# Patient Record
Sex: Male | Born: 1955
Health system: Southern US, Community
[De-identification: ages and names within clinical notes are randomized; demographics above are authoritative.]

## PROBLEM LIST (undated history)

## (undated) DIAGNOSIS — F329 Major depressive disorder, single episode, unspecified: Secondary | ICD-10-CM

## (undated) DIAGNOSIS — F32A Depression, unspecified: Secondary | ICD-10-CM

## (undated) DIAGNOSIS — D689 Coagulation defect, unspecified: Secondary | ICD-10-CM

## (undated) DIAGNOSIS — N189 Chronic kidney disease, unspecified: Secondary | ICD-10-CM

## (undated) DIAGNOSIS — B1920 Unspecified viral hepatitis C without hepatic coma: Secondary | ICD-10-CM

## (undated) DIAGNOSIS — D66 Hereditary factor VIII deficiency: Secondary | ICD-10-CM

## (undated) HISTORY — PX: JOINT REPLACEMENT: SHX530

## (undated) HISTORY — DX: Coagulation defect, unspecified: D68.9

## (undated) HISTORY — DX: Chronic kidney disease, unspecified: N18.9

## (undated) HISTORY — DX: Depression, unspecified: F32.A

## (undated) HISTORY — DX: Hereditary factor VIII deficiency: D66

## (undated) HISTORY — DX: Unspecified viral hepatitis C without hepatic coma: B19.20

## (undated) HISTORY — DX: Major depressive disorder, single episode, unspecified: F32.9

---

## 2011-03-08 ENCOUNTER — Ambulatory Visit (INDEPENDENT_AMBULATORY_CARE_PROVIDER_SITE_OTHER): Payer: Managed Care, Other (non HMO) | Admitting: Family Medicine

## 2011-03-08 DIAGNOSIS — F4321 Adjustment disorder with depressed mood: Secondary | ICD-10-CM

## 2011-03-08 DIAGNOSIS — F17200 Nicotine dependence, unspecified, uncomplicated: Secondary | ICD-10-CM

## 2011-03-08 DIAGNOSIS — M2550 Pain in unspecified joint: Secondary | ICD-10-CM

## 2011-05-28 ENCOUNTER — Encounter: Payer: Self-pay | Admitting: Internal Medicine

## 2011-05-28 ENCOUNTER — Ambulatory Visit (INDEPENDENT_AMBULATORY_CARE_PROVIDER_SITE_OTHER): Payer: Managed Care, Other (non HMO) | Admitting: Internal Medicine

## 2011-05-28 DIAGNOSIS — F32A Depression, unspecified: Secondary | ICD-10-CM | POA: Insufficient documentation

## 2011-05-28 DIAGNOSIS — Z7189 Other specified counseling: Secondary | ICD-10-CM

## 2011-05-28 DIAGNOSIS — F339 Major depressive disorder, recurrent, unspecified: Secondary | ICD-10-CM

## 2011-05-28 DIAGNOSIS — R5383 Other fatigue: Secondary | ICD-10-CM

## 2011-05-28 DIAGNOSIS — G8929 Other chronic pain: Secondary | ICD-10-CM | POA: Insufficient documentation

## 2011-05-28 DIAGNOSIS — M2500 Hemarthrosis, unspecified joint: Secondary | ICD-10-CM

## 2011-05-28 DIAGNOSIS — B1920 Unspecified viral hepatitis C without hepatic coma: Secondary | ICD-10-CM | POA: Insufficient documentation

## 2011-05-28 DIAGNOSIS — D66 Hereditary factor VIII deficiency: Secondary | ICD-10-CM | POA: Insufficient documentation

## 2011-05-28 DIAGNOSIS — F329 Major depressive disorder, single episode, unspecified: Secondary | ICD-10-CM

## 2011-05-28 LAB — POCT URINALYSIS DIPSTICK
Glucose, UA: NEGATIVE
Ketones, UA: NEGATIVE
Spec Grav, UA: >=1.03

## 2011-05-28 LAB — POCT UA - MICROSCOPIC ONLY
Mucus, UA: NEGATIVE
WBC, Ur, HPF, POC: NEGATIVE

## 2011-05-28 NOTE — Progress Notes (Signed)
  Subjective:    Patient ID: Darren Torres, male    DOB: 06/26/1955, 56 y.o.   MRN: [id number]  HPI No problems at this time. See problem list.   Review of Systems Cpe scheduled soon    Objective:   Physical Exam  Normal for him today      Assessment & Plan:   Full CPE soon Will refill meds as needed  Hemtologist [facility] Hepatologist [phone]

## 2011-05-29 LAB — CBC WITH DIFFERENTIAL/PLATELET
Basophils Absolute: 0.1 10*3/uL (ref 0.0–0.1)
Eosinophils Relative: 4 % (ref 0–5)
HCT: 45.3 % (ref 39.0–52.0)
Lymphocytes Relative: 44 % (ref 12–46)
MCHC: 34.7 g/dL (ref 30.0–36.0)
MCV: 93.8 fL (ref 78.0–100.0)
Monocytes Absolute: 0.6 10*3/uL (ref 0.1–1.0)
Monocytes Relative: 9 % (ref 3–12)
RDW: 12.6 % (ref 11.5–15.5)
WBC: 5.9 10*3/uL (ref 4.0–10.5)

## 2011-05-29 LAB — COMPREHENSIVE METABOLIC PANEL
AST: 36 U/L (ref 0–37)
BUN: 12 mg/dL (ref 6–23)
Calcium: 9.7 mg/dL (ref 8.4–10.5)
Chloride: 105 mEq/L (ref 96–112)
Creat: 0.63 mg/dL (ref 0.50–1.35)
Glucose, Bld: 98 mg/dL (ref 70–99)

## 2011-05-29 LAB — LIPID PANEL
Cholesterol: 155 mg/dL (ref 0–200)
HDL: 45 mg/dL (ref >39)
Total CHOL/HDL Ratio: 3.4 Ratio
Triglycerides: 80 mg/dL (ref <150)

## 2011-05-29 LAB — TSH: TSH: 1.437 u[IU]/mL (ref 0.350–4.500)

## 2011-05-29 LAB — PSA: PSA: 0.81 ng/mL (ref <=4.00)

## 2011-06-26 ENCOUNTER — Encounter: Payer: Self-pay | Admitting: Internal Medicine

## 2011-08-20 ENCOUNTER — Ambulatory Visit (INDEPENDENT_AMBULATORY_CARE_PROVIDER_SITE_OTHER): Payer: Medicare Other | Admitting: Internal Medicine

## 2011-08-20 ENCOUNTER — Encounter: Payer: Self-pay | Admitting: Internal Medicine

## 2011-08-20 VITALS — BP 130/77 | HR 64 | Temp 97.2°F | Resp 16 | Ht 70.5 in | Wt 145.5 lb

## 2011-08-20 DIAGNOSIS — N200 Calculus of kidney: Secondary | ICD-10-CM

## 2011-08-20 DIAGNOSIS — D66 Hereditary factor VIII deficiency: Secondary | ICD-10-CM

## 2011-08-20 DIAGNOSIS — R319 Hematuria, unspecified: Secondary | ICD-10-CM

## 2011-08-20 LAB — POCT URINALYSIS DIPSTICK
Leukocytes, UA: NEGATIVE
Nitrite, UA: NEGATIVE
Protein, UA: NEGATIVE
Urobilinogen, UA: 0.2
pH, UA: 6

## 2011-08-20 LAB — POCT UA - MICROSCOPIC ONLY
Casts, Ur, LPF, POC: NEGATIVE
Yeast, UA: NEGATIVE

## 2011-08-20 NOTE — Progress Notes (Signed)
  Subjective:    Patient ID: Quentyn Kolbeck, male    DOB: Oct 20, 1955, 56 y.o.   MRN: [id number]  HPI Kidney stone in er,has past it. Has hemophilia, hep c . Used factor 8 for brisk hematuria. Still smoking   Review of Systems See list    Objective:   Physical Exam  Normal  ccua    Results for orders placed in visit on 08/20/11  POCT URINALYSIS DIPSTICK      Component Value Range   Color, UA yellow     Clarity, UA clear     Glucose, UA neg     Bilirubin, UA neg     Ketones, UA neg     Spec Grav, UA <=1.005     Blood, UA neg     pH, UA 6.0     Protein, UA neg     Urobilinogen, UA 0.2     Nitrite, UA neg     Leukocytes, UA Negative    POCT UA - MICROSCOPIC ONLY      Component Value Range   WBC, Ur, HPF, POC neg     RBC, urine, microscopic 0-1     Bacteria, U Microscopic neg     Mucus, UA neg     Epithelial cells, urine per micros o-1     Crystals, Ur, HPF, POC neg     Casts, Ur, LPF, POC neg     Yeast, UA neg      Assessment & Plan:  Hematuria resolved

## 2011-09-03 ENCOUNTER — Encounter: Payer: Medicare Other | Admitting: Internal Medicine

## 2011-09-10 ENCOUNTER — Ambulatory Visit: Payer: Medicare Other | Admitting: Internal Medicine

## 2011-11-23 ENCOUNTER — Other Ambulatory Visit: Payer: Self-pay | Admitting: Internal Medicine

## 2011-11-23 NOTE — Telephone Encounter (Signed)
Needs office visit.

## 2011-12-17 ENCOUNTER — Encounter: Payer: Medicare Other | Admitting: Internal Medicine

## 2012-01-21 ENCOUNTER — Other Ambulatory Visit: Payer: Self-pay | Admitting: *Deleted

## 2012-01-21 ENCOUNTER — Other Ambulatory Visit: Payer: Self-pay | Admitting: Physician Assistant

## 2012-01-21 NOTE — Telephone Encounter (Signed)
PATTY STATES HER HUSBAND'S MEDS NEED TO BE CALLED IN FOR A 90 DAY SUPPLY WITH REFILLS TO LAST A YEAR HE IS ON A LOT OF MEDS AND THEY ALL NEED REFILLING THE SAME WAY PLEASE CALL [phone]   [facility] ON [address]

## 2012-04-28 ENCOUNTER — Ambulatory Visit (INDEPENDENT_AMBULATORY_CARE_PROVIDER_SITE_OTHER): Payer: Medicare PPO | Admitting: Internal Medicine

## 2012-04-28 ENCOUNTER — Encounter: Payer: Self-pay | Admitting: Internal Medicine

## 2012-04-28 ENCOUNTER — Other Ambulatory Visit: Payer: Self-pay | Admitting: *Deleted

## 2012-04-28 ENCOUNTER — Ambulatory Visit: Payer: Medicare PPO

## 2012-04-28 ENCOUNTER — Telehealth: Payer: Self-pay | Admitting: *Deleted

## 2012-04-28 VITALS — BP 119/71 | HR 66 | Temp 98.2°F | Resp 18 | Ht 69.0 in | Wt 147.0 lb

## 2012-04-28 DIAGNOSIS — Z0000 Encounter for general adult medical examination without abnormal findings: Secondary | ICD-10-CM

## 2012-04-28 DIAGNOSIS — D66 Hereditary factor VIII deficiency: Secondary | ICD-10-CM

## 2012-04-28 DIAGNOSIS — H269 Unspecified cataract: Secondary | ICD-10-CM

## 2012-04-28 DIAGNOSIS — Z13220 Encounter for screening for lipoid disorders: Secondary | ICD-10-CM

## 2012-04-28 DIAGNOSIS — M1990 Unspecified osteoarthritis, unspecified site: Secondary | ICD-10-CM

## 2012-04-28 DIAGNOSIS — F17200 Nicotine dependence, unspecified, uncomplicated: Secondary | ICD-10-CM

## 2012-04-28 DIAGNOSIS — Z125 Encounter for screening for malignant neoplasm of prostate: Secondary | ICD-10-CM

## 2012-04-28 DIAGNOSIS — R269 Unspecified abnormalities of gait and mobility: Secondary | ICD-10-CM

## 2012-04-28 DIAGNOSIS — K219 Gastro-esophageal reflux disease without esophagitis: Secondary | ICD-10-CM

## 2012-04-28 LAB — COMPREHENSIVE METABOLIC PANEL
Albumin: 4.3 g/dL (ref 3.5–5.2)
Alkaline Phosphatase: 122 U/L — ABNORMAL HIGH (ref 39–117)
BUN: 13 mg/dL (ref 6–23)
CO2: 27 mEq/L (ref 19–32)
Calcium: 9.3 mg/dL (ref 8.4–10.5)
Chloride: 107 mEq/L (ref 96–112)
Glucose, Bld: 98 mg/dL (ref 70–99)
Potassium: 3.7 mEq/L (ref 3.5–5.3)
Total Protein: 6.9 g/dL (ref 6.0–8.3)

## 2012-04-28 LAB — POCT URINALYSIS DIPSTICK
Blood, UA: NEGATIVE
Ketones, UA: NEGATIVE
Protein, UA: NEGATIVE
Spec Grav, UA: 1.015
Urobilinogen, UA: 0.2

## 2012-04-28 LAB — TSH: TSH: 2.855 u[IU]/mL (ref 0.350–4.500)

## 2012-04-28 LAB — IFOBT (OCCULT BLOOD): IFOBT: POSITIVE

## 2012-04-28 LAB — CBC WITH DIFFERENTIAL/PLATELET
Basophils Relative: 1 % (ref 0–1)
HCT: 43.4 % (ref 39.0–52.0)
Hemoglobin: 15 g/dL (ref 13.0–17.0)
Lymphs Abs: 2.5 10*3/uL (ref 0.7–4.0)
MCHC: 34.6 g/dL (ref 30.0–36.0)
Monocytes Absolute: 0.5 10*3/uL (ref 0.1–1.0)
Monocytes Relative: 9 % (ref 3–12)
Neutro Abs: 2.6 10*3/uL (ref 1.7–7.7)
RBC: 4.99 MIL/uL (ref 4.22–5.81)

## 2012-04-28 LAB — POCT UA - MICROSCOPIC ONLY
Bacteria, U Microscopic: NEGATIVE
Casts, Ur, LPF, POC: NEGATIVE
Crystals, Ur, HPF, POC: NEGATIVE

## 2012-04-28 LAB — LIPID PANEL
Cholesterol: 162 mg/dL (ref 0–200)
Triglycerides: 80 mg/dL (ref <150)

## 2012-04-28 LAB — PULMONARY FUNCTION TEST

## 2012-04-28 LAB — PSA: PSA: 0.77 ng/mL (ref <=4.00)

## 2012-04-28 MED ORDER — CELECOXIB 100 MG PO CAPS: 200.0000 mg | ORAL_CAPSULE | Freq: Two times a day (BID) | ORAL | Status: DC

## 2012-04-28 MED ORDER — CELECOXIB 200 MG PO CAPS: 200.0000 mg | ORAL_CAPSULE | Freq: Two times a day (BID) | ORAL | Status: DC

## 2012-04-28 MED ORDER — CITALOPRAM HYDROBROMIDE 40 MG PO TABS: 40.0000 mg | ORAL_TABLET | Freq: Every day | ORAL | Status: DC

## 2012-04-28 MED ORDER — RANITIDINE HCL 150 MG PO TABS: 150.0000 mg | ORAL_TABLET | Freq: Two times a day (BID) | ORAL | Status: DC

## 2012-04-28 NOTE — Telephone Encounter (Signed)
Error

## 2012-04-28 NOTE — Progress Notes (Signed)
  Subjective:    Patient ID: Darren Torres, male    DOB: Sep 22, 1955, 57 y.o.   MRN: [id number]  HPI    Review of Systems  Constitutional: Positive for diaphoresis and fatigue.  HENT: Positive for hearing loss and tinnitus.   Eyes: Positive for discharge.  Respiratory: Positive for apnea.   Gastrointestinal: Positive for constipation.  Musculoskeletal: Positive for arthralgias and gait problem.  Hematological: Bruises/bleeds easily.       Objective:   Physical Exam        Assessment & Plan:

## 2012-04-28 NOTE — Patient Instructions (Signed)
Immunization Schedule, Adult  Influenza vaccine.  Adults should be given 1 dose every year.  Tetanus, diphtheria, and pertussis (Td, Tdap) vaccine.  Adults who have not previously been given Tdap or who do not know their vaccine status should be given 1 dose of Tdap.  Adults should have a Td booster every 10 years.  Doses should be given if needed to catch up on missed doses in the past.  Pregnant women should be given 1 dose of Tdap vaccine during each pregnancy.  Varicella vaccine.  All adults without evidence of immunity to varicella should receive 2 doses or a second dose if they have received only 1 dose.  Pregnant women who do not have evidence of immunity should be given the first dose after their pregnancy.  Human papillomavirus (HPV) vaccine.  Women aged 13 through 26 years who have not been given the vaccine previously should be given the 3 dose series. The second dose should be given 1 to 2 months after the first dose. The third dose should be given at least 24 weeks after the first dose.  The vaccine is not recommended for use in pregnant women. However, pregnancy testing is not needed before being given a dose. If a woman is found to be pregnant after being given a dose, no treatment is needed. In that case, the remaining doses should be delayed until after the pregnancy.  Men aged 13 through 21 years who have not been given the vaccine previously should be given the 3 dose series. Men aged 22 through 26 years may be given the 3 dose series. The second dose should be given 1 to 2 months after the first dose. The third dose should be given at least 24 weeks after the first dose.  Zoster vaccine.  One dose is recommended for adults aged 60 years and older unless certain conditions are present.  Measles, mumps, and rubella (MMR) vaccine.  Adults born before 1957 generally are considered immune to measles and mumps. Healthcare workers born before 1957 who do not have  evidence of immunity should consider vaccination.  Adults born in 1957 or later should have 1 or more doses of MMR vaccine unless there is a contraindication for the vaccine or they have evidence of immunity to the diseases. A second dose should be given at least 28 days after the first dose. Adults receiving certain types of previous vaccines should consider or be given vaccine doses.  For women of childbearing age, rubella immunity should be determined. If there is no evidence of immunity, women who are not pregnant should be vaccinated. If there is no evidence of immunity, women who are pregnant should delay vaccination until after their pregnancy.  Pneumococcal polysaccharide (PPSV23) vaccine.  All adults aged 65 years and older should be given 1 dose.  Adults younger than age 65 years who have certain medical conditions, who smoke cigarettes, who reside in nursing homes or long-term care facilities, or who have an unknown vaccination history should usually be given 1 or 2 doses of the vaccine.  Pneumococcal 13-valent conjugate (PVC13) vaccine.  Adults aged 19 years or older with certain medical conditions and an unknown or incomplete pneumococcal vaccination history should usually be given 1 dose of the vaccine. This dose may be in addition to a PPSV23 vaccine dose.  Meningococcal vaccine.  First-year college students up to age 21 years who are living in residence halls should be given a dose if they did not receive a dose on   or after their 16th birthday.  A dose should be given to microbiologists working with certain meningitis bacteria, military recruits, and people who travel to or live in countries with a high rate of meningitis.  One or 2 doses should be given to adults who have certain high-risk conditions.  Hepatitis A vaccine.  Adults who wish to be protected from this disease, who have certain high-risk conditions, who work with hepatitis A-infected animals, who work in  hepatitis A research labs, or who travel to or work in countries with a high rate of hepatitis A should be given the 2 dose series of the vaccine.  Adults who were previously unvaccinated and who anticipate close contact with an international adoptee during the first 60 days after arrival in the [place] from a country with a high rate of hepatitis A should be given the vaccine. The first dose of the 2 dose series should be given 2 or more weeks before the arrival of the adoptee.  Hepatitis B vaccine.  Adults who wish to be protected from this disease, who have certain high-risk conditions, who may be exposed to blood or other infectious body fluids, who are household contacts or sex partners of hepatitis B positive people, who are clients or workers in certain care facilities, or who travel to or work in countries with a high rate of hepatitis B should be given the 3 dose series of the vaccine. If you travel outside the [place], additional vaccines may be needed. The Centers for Disease Control and Prevention (CDC) provides information about the vaccines, medicines, and other measures necessary to prevent illness and injury during international travel. Visit the CDC website at www.cdc.gov/travel or call (800) CDC-INFO [800-232-4636]. You may also consult a travel clinic or your caregiver. Document Released: 05/26/2003 Document Revised: 05/28/2011 Document Reviewed: 04/20/2011 [employer] Patient Information 2013 [employer].  

## 2012-04-28 NOTE — Telephone Encounter (Signed)
[facility] would like for you to clarify directions for celebrex

## 2012-04-28 NOTE — Progress Notes (Signed)
  Subjective:    Patient ID: Darren Torres, male    DOB: [birth year], 57 y.o.   MRN: [id number]  HPI Active hepatitis C txed twice at [facility]. Severe hemophilia with associated severe arthropathy with many joint surgeries and total joints. Depression and chronic pain. GERD Chronic pain on daily morphine etc.  Review of Systems  Constitutional: Positive for diaphoresis and fatigue.  HENT: Positive for tinnitus.   Eyes: Positive for discharge.  Respiratory: Positive for apnea and cough.   Cardiovascular: Positive for palpitations.  Gastrointestinal: Positive for constipation.  Musculoskeletal: Positive for joint swelling, arthralgias and gait problem.  Allergic/Immunologic: Negative.   Neurological: Negative.   Hematological: Bruises/bleeds easily.  Psychiatric/Behavioral: Positive for dysphoric mood.       Objective:   Physical Exam  Constitutional: He is oriented to person, place, and time. Vital signs are normal. He appears cachectic. He is cooperative. He has a sickly appearance. No distress.  HENT:  Right Ear: External ear normal.  Left Ear: External ear normal.  Nose: Nose normal.  Mouth/Throat: Oropharynx is clear and moist.  Eyes: EOM are normal. Pupils are equal, round, and reactive to light. No scleral icterus.  Neck: Normal range of motion. Neck supple. No tracheal deviation present. No thyromegaly present.  Cardiovascular: Normal rate, regular rhythm and normal heart sounds.   Pulmonary/Chest: Effort normal and breath sounds normal. He has no wheezes. He exhibits no tenderness.  Abdominal: Soft. Bowel sounds are normal. He exhibits no mass. There is tenderness.  Genitourinary: Rectum normal, prostate normal and penis normal.  Musculoskeletal: He exhibits edema and tenderness.  Has new right shoulder total joint Has right and left total knees Has fused left ankle and wants right fused Needs left shoulder  Lymphadenopathy:    He has no cervical adenopathy.   Neurological: He is alert and oriented to person, place, and time. He displays no atrophy. No cranial nerve deficit or sensory deficit. Coordination and gait abnormal.  Skin: No rash noted.  Psychiatric: He has a normal mood and affect. His behavior is normal. Judgment and thought content normal.   Results for orders placed in visit on 08/20/11  POCT URINALYSIS DIPSTICK      Result Value Range   Color, UA yellow     Clarity, UA clear     Glucose, UA neg     Bilirubin, UA neg     Ketones, UA neg     Spec Grav, UA <=1.005     Blood, UA neg     pH, UA 6.0     Protein, UA neg     Urobilinogen, UA 0.2     Nitrite, UA neg     Leukocytes, UA Negative    POCT UA - MICROSCOPIC ONLY      Result Value Range   WBC, Ur, HPF, POC neg     RBC, urine, microscopic 0-1     Bacteria, U Microscopic neg     Mucus, UA neg     Epithelial cells, urine per micros o-1     Crystals, Ur, HPF, POC neg     Casts, Ur, LPF, POC neg     Yeast, UA neg            Assessment & Plan:  RF all meds 1 year Discussed risks of celebrex GI, kidney, CV, liver and we agreed to ween of and dc celebrex

## 2012-09-19 ENCOUNTER — Other Ambulatory Visit: Payer: Self-pay | Admitting: Physician Assistant

## 2012-10-14 ENCOUNTER — Ambulatory Visit (INDEPENDENT_AMBULATORY_CARE_PROVIDER_SITE_OTHER): Payer: Medicare PPO | Admitting: Family Medicine

## 2012-10-14 VITALS — BP 120/68 | HR 60 | Temp 97.8°F | Resp 16 | Ht 68.5 in | Wt 136.0 lb

## 2012-10-14 DIAGNOSIS — D66 Hereditary factor VIII deficiency: Secondary | ICD-10-CM

## 2012-10-14 MED ORDER — ANTIHEM FACTOR RECOMB (RFVIII) 500 UNITS IV KIT
44.0000 [IU]/kg | PACK | Freq: Once | INTRAVENOUS | Status: AC
  Administered 2012-10-14: 2715 [IU] via INTRAVENOUS

## 2012-10-14 NOTE — Progress Notes (Signed)
Subjective:    Patient ID: Darren Torres, male    DOB: [birth year], 57 y.o.   MRN: [id number] Chief Complaint  Patient presents with  . Injections    needs clotting factor injected his wife was unable to get vein.    HPI  Has a h/o hemophilia treated with IV factor 8 when he has a brisk bleed. Followed by [facility] hematology. Wife always gives IV factor 8 at home when pt is bleeding but today has tried 4 times but keeps on blowing the vein so they came here for injection.  The medication has already been reconstituted and is in a syringe - has to be administed w/in 3 hrs and they are still well w/in that window. He is having bleeding in his left elbow - it is wrapped and in a sling, has pain with any movement. Is currently on chronic pain medication.  Past Medical History  Diagnosis Date  . Hemophilia   . Depression   . Hepatitis C   . Chronic kidney disease   . Clotting disorder    Current Outpatient Prescriptions on File Prior to Visit  Medication Sig Dispense Refill  . calcium-vitamin D (OSCAL WITH D) 250-125 MG-UNIT per tablet Take 1 tablet by mouth daily.      Jennette Banker Sodium 30-100 MG CAPS Take by mouth.      . morphine (MSIR) 15 MG tablet Take 15 mg by mouth every 4 (four) hours as needed.      . ranitidine (ZANTAC) 150 MG tablet Take 1 tablet (150 mg total) by mouth 2 (two) times daily.  180 tablet  3  . celecoxib (CELEBREX) 100 MG capsule Take 2 capsules (200 mg total) by mouth 2 (two) times daily. Take one 200mg  po bid, po pc and watch for dark stools  180 capsule  3  . celecoxib (CELEBREX) 200 MG capsule Take 1 capsule (200 mg total) by mouth 2 (two) times daily. As needed for pain  180 capsule  3  . citalopram (CELEXA) 40 MG tablet Take 1 tablet (40 mg total) by mouth daily.  90 tablet  2  . lactobacillus acidophilus (BACID) TABS Take 2 tablets by mouth daily.      . Multiple Vitamin (MULTIVITAMIN) tablet Take 1 tablet by mouth daily.      . NON FORMULARY  Complete thymic formula immune system complex various vits and herbs take cap qd       No current facility-administered medications on file prior to visit.   Allergies  Allergen Reactions  . Chantix (Varenicline Tartrate) Other (See Comments)    Changes mood  . Wellbutrin (Bupropion Hcl) Palpitations  . Asa Arthritis Strength-Antacid (Aspirin Buffered) Other (See Comments)    Due to blood thinner  . Other     cryopercipate     Review of Systems  Musculoskeletal: Positive for myalgias, joint swelling and arthralgias.  Skin: Positive for color change and wound.      BP 120/68  Pulse 60  Temp(Src) 97.8 F (36.6 C) (Oral)  Resp 16  Ht 5' 8.5" (1.74 m)  Wt 136 lb (61.689 kg)  BMI 20.38 kg/m2  SpO2 96% Objective:   Physical Exam  Constitutional: He is oriented to person, place, and time. He appears well-developed. No distress.  HENT:  Head: Normocephalic and atraumatic.  Pulmonary/Chest: Effort normal.  Neurological: He is alert and oriented to person, place, and time.  Skin: Skin is warm and dry. He is not diaphoretic.  Psychiatric: He has  a normal mood and affect. His behavior is normal.      Assessment & Plan:  Hemophilia

## 2012-10-27 ENCOUNTER — Ambulatory Visit: Payer: Medicare PPO | Admitting: Internal Medicine

## 2012-11-03 ENCOUNTER — Ambulatory Visit (INDEPENDENT_AMBULATORY_CARE_PROVIDER_SITE_OTHER): Payer: Medicare PPO | Admitting: Internal Medicine

## 2012-11-03 ENCOUNTER — Encounter: Payer: Self-pay | Admitting: Internal Medicine

## 2012-11-03 VITALS — BP 126/78 | HR 60 | Temp 97.9°F | Resp 16 | Ht 70.0 in | Wt 141.0 lb

## 2012-11-03 DIAGNOSIS — Z79899 Other long term (current) drug therapy: Secondary | ICD-10-CM

## 2012-11-03 DIAGNOSIS — G894 Chronic pain syndrome: Secondary | ICD-10-CM

## 2012-11-03 DIAGNOSIS — G8929 Other chronic pain: Secondary | ICD-10-CM

## 2012-11-03 DIAGNOSIS — M19012 Primary osteoarthritis, left shoulder: Secondary | ICD-10-CM

## 2012-11-03 DIAGNOSIS — M19019 Primary osteoarthritis, unspecified shoulder: Secondary | ICD-10-CM

## 2012-11-03 DIAGNOSIS — D66 Hereditary factor VIII deficiency: Secondary | ICD-10-CM

## 2012-11-03 LAB — CBC WITH DIFFERENTIAL/PLATELET
Basophils Absolute: 0.1 10*3/uL (ref 0.0–0.1)
Basophils Relative: 1 % (ref 0–1)
Eosinophils Relative: 3 % (ref 0–5)
HCT: 41 % (ref 39.0–52.0)
Lymphocytes Relative: 47 % — ABNORMAL HIGH (ref 12–46)
MCH: 30.5 pg (ref 26.0–34.0)
MCHC: 34.6 g/dL (ref 30.0–36.0)
MCV: 88 fL (ref 78.0–100.0)
Monocytes Absolute: 0.4 10*3/uL (ref 0.1–1.0)
RDW: 13.6 % (ref 11.5–15.5)

## 2012-11-03 LAB — COMPREHENSIVE METABOLIC PANEL
AST: 24 U/L (ref 0–37)
BUN: 13 mg/dL (ref 6–23)
Calcium: 9.3 mg/dL (ref 8.4–10.5)
Chloride: 104 mEq/L (ref 96–112)
Creat: 0.64 mg/dL (ref 0.50–1.35)

## 2012-11-03 NOTE — Progress Notes (Signed)
  Subjective:    Patient ID: Darren Torres, male    DOB: 11/03/1955, 57 y.o.   MRN: [id number]  HPI Hemophilia--had recent transfusion of cloters. Joints are stable, swell and bleed less. Still smoking   Review of Systems unchanged    Objective:   Physical Exam  Vitals reviewed. Constitutional: He is oriented to person, place, and time. He appears well-developed and well-nourished. No distress.  HENT:  Right Ear: External ear normal.  Left Ear: External ear normal.  Eyes: EOM are normal. No scleral icterus.  Neck: Neck supple.  Cardiovascular: Normal rate and normal heart sounds.   Pulmonary/Chest: Effort normal.  Musculoskeletal: He exhibits edema and tenderness.  Indurated, tender joints all over asymmetric caused by chronic bleeding int joints  Neurological: He is alert and oriented to person, place, and time. No cranial nerve deficit. He exhibits normal muscle tone. Gait abnormal. Coordination normal.  Skin: Rash noted.  Psychiatric: He has a normal mood and affect. His behavior is normal.    Cbc/cmet      Assessment & Plan:  DCed celebrex using morphine only Hemophilia stable/controlled Smoker/Must quit

## 2012-11-03 NOTE — Patient Instructions (Signed)
Smoking and Your Digestive System Cigarette smoking causes many life-threatening diseases. These include lung cancer, other cancers, emphysema, and heart disease. About 430,000 deaths each year are directly caused by cigarette smoking. Smoking results in disease-causing changes in all parts of the body. This includes the digestive system. This can cause serious effects, since the digestive system converts foods into nutrients the body needs to live. Smoking has been shown to have harmful effects on all parts of the digestive system. It adds to common disorders, such as heartburn and peptic ulcers. It also increases the risk of Crohn's disease, and possibly gallstones. Smoking seems to affect the liver by changing the way it handles drugs and alcohol and removes them. In fact, there seems to be enough evidence to stop smoking based solely on digestive distress. Some of the harmful effects of smoking are:  Heartburn (acid reflux).  Heartburn happens when acidic juices from the stomach splash into the esophagus, which has a more sensitive and less acid-resistant lining than the stomach. Normally, a muscular valve at the lower end of the esophagus keeps out the acid solution in the stomach. Smoking decreases the strength of the esophageal valve and its ability to keep out acidic stomach contents. This allows stomach acid reflux, or flow backward into the esophagus.  Smoking also seems to promote the movement of bile salts from the intestine to the stomach. This makes stomach acids more harmful.  A peptic ulcer is an open sore in the lining of the stomach or duodenum (first part of the small intestine). The exact cause of ulcers is not known. A link between smoking cigarettes and ulcers, especially duodenal ulcers, does exist. Ulcers are more likely to occur, less likely to heal, and more likely to cause death in smokers than in nonsmokers.  Some research suggests that smoking might increase a person's risk of  infection with the bacterium Helicobacter pylori (H. pylori). Most peptic ulcers are caused by this bacterium.  Stomach acid is also important in causing ulcers. Normally, most of this acid is buffered (neutralized) by the food we eat. Most of the unbuffered acid that enters the duodenum is quickly neutralized by sodium bicarbonate. This is a naturally occurring alkali, produced by the pancreas. Some studies show that smoking reduces the bicarbonate produced by the pancreas. This interferes with the neutralization of acid in the duodenum. Other studies suggest that chronic cigarette smoking may increase the amount of acid produced by the stomach.  Whatever causes the link between smoking and ulcers, two points have been repeatedly shown. People who smoke are more likely to develop an ulcer, especially a duodenal ulcer. Ulcers in smokers are less likely to heal quickly in response to otherwise effective treatment. This research strongly suggests that a person with an ulcer should stop smoking.  The liver is an important organ with many tasks. One task of the liver is to prepare drugs, alcohol, and other toxins for elimination (removal) from the body. There is evidence that smoking alters the ability of the liver to effectively handle such substances. In some cases, this may influence the dose of medicine needed to treat an illness. Some research suggests that smoking can aggravate and speed up the course of liver disease caused by excessive alcohol intake.  Studies have shown that smokers have weaker or less frequent stomach contractions while smoking, which can cause less efficient digestion.  Crohn's disease causes inflammation deep in the lining of the intestine. The disease causes pain and diarrhea. It usually  affects the small intestine, but it can occur anywhere in the digestive tract. Research shows that current and former smokers have a higher risk of developing Crohn's disease than nonsmokers.  Among people with the disease, smoking is linked with a higher rate of relapse, repeat surgery, and immunosuppressive treatment. In all areas, the risk for women who are current or former smokers is slightly higher than for men. Why smoking increases the risk of Crohn's disease is unknown.  Several studies suggest that smoking may increase the risk of developing gallstones. The risk may be higher for women. Research results on this topic are not consistent. More studies are needed.  Oral (lip and mouth) cancer and cancer of the pharynx (throat) and the esophagus are caused by smoking. Smoking may be associated with pancreatic cancer.  Some of the effects of smoking on the digestive system seem to be short-lived. For example, the effect of smoking on bicarbonate production by the pancreas does not appear to last. Half an hour after smoking, the production of bicarbonate returns to normal. The effects of smoking on how the liver handles drugs also disappear when a person stops smoking. However, people who no longer smoke still remain at risk for Crohn's disease. Document Released: 02/16/2004 Document Revised: 05/28/2011 Document Reviewed: 12/20/2008 [facility] Patient Information 2014 [place], [place]. Smoking Cessation Quitting smoking is important to your health and has many advantages. However, it is not always easy to quit since nicotine is a very addictive drug. Often times, people try 3 times or more before being able to quit. This document explains the best ways for you to prepare to quit smoking. Quitting takes hard work and a lot of effort, but you can do it. ADVANTAGES OF QUITTING SMOKING  You will live longer, feel better, and live better.  Your body will feel the impact of quitting smoking almost immediately.  Within 20 minutes, blood pressure decreases. Your pulse returns to its normal level.  After 8 hours, carbon monoxide levels in the blood return to normal. Your oxygen level  increases.  After 24 hours, the chance of having a heart attack starts to decrease. Your breath, hair, and body stop smelling like smoke.  After 48 hours, damaged nerve endings begin to recover. Your sense of taste and smell improve.  After 72 hours, the body is virtually free of nicotine. Your bronchial tubes relax and breathing becomes easier.  After 2 to 12 weeks, lungs can hold more air. Exercise becomes easier and circulation improves.  The risk of having a heart attack, stroke, cancer, or lung disease is greatly reduced.  After 1 year, the risk of coronary heart disease is cut in half.  After 5 years, the risk of stroke falls to the same as a nonsmoker.  After 10 years, the risk of lung cancer is cut in half and the risk of other cancers decreases significantly.  After 15 years, the risk of coronary heart disease drops, usually to the level of a nonsmoker.  If you are pregnant, quitting smoking will improve your chances of having a healthy baby.  The people you live with, especially any children, will be healthier.  You will have extra money to spend on things other than cigarettes. QUESTIONS TO THINK ABOUT BEFORE ATTEMPTING TO QUIT You may want to talk about your answers with your caregiver.  Why do you want to quit?  If you tried to quit in the past, what helped and what did not?  What will be the most  difficult situations for you after you quit? How will you plan to handle them?  Who can help you through the tough times? Your family? Friends? A caregiver?  What pleasures do you get from smoking? What ways can you still get pleasure if you quit? Here are some questions to ask your caregiver:  How can you help me to be successful at quitting?  What medicine do you think would be best for me and how should I take it?  What should I do if I need more help?  What is smoking withdrawal like? How can I get information on withdrawal? GET READY  Set a quit  date.  Change your environment by getting rid of all cigarettes, ashtrays, matches, and lighters in your home, car, or work. Do not let people smoke in your home.  Review your past attempts to quit. Think about what worked and what did not. GET SUPPORT AND ENCOURAGEMENT You have a better chance of being successful if you have help. You can get support in many ways.  Tell your family, friends, and co-workers that you are going to quit and need their support. Ask them not to smoke around you.  Get individual, group, or telephone counseling and support. Programs are available at [employer] and health centers. Call your local health department for information about programs in your area.  Spiritual beliefs and practices may help some smokers quit.  Download a "quit meter" on your computer to keep track of quit statistics, such as how long you have gone without smoking, cigarettes not smoked, and money saved.  Get a self-help book about quitting smoking and staying off of tobacco. LEARN NEW SKILLS AND BEHAVIORS  Distract yourself from urges to smoke. Talk to someone, go for a walk, or occupy your time with a task.  Change your normal routine. Take a different route to work. Drink tea instead of coffee. Eat breakfast in a different place.  Reduce your stress. Take a hot bath, exercise, or read a book.  Plan something enjoyable to do every day. Reward yourself for not smoking.  Explore interactive web-based programs that specialize in helping you quit. GET MEDICINE AND USE IT CORRECTLY Medicines can help you stop smoking and decrease the urge to smoke. Combining medicine with the above behavioral methods and support can greatly increase your chances of successfully quitting smoking.  Nicotine replacement therapy helps deliver nicotine to your body without the negative effects and risks of smoking. Nicotine replacement therapy includes nicotine gum, lozenges, inhalers, nasal sprays, and  skin patches. Some may be available over-the-counter and others require a prescription.  Antidepressant medicine helps people abstain from smoking, but how this works is unknown. This medicine is available by prescription.  Nicotinic receptor partial agonist medicine simulates the effect of nicotine in your brain. This medicine is available by prescription. Ask your caregiver for advice about which medicines to use and how to use them based on your health history. Your caregiver will tell you what side effects to look out for if you choose to be on a medicine or therapy. Carefully read the information on the package. Do not use any other product containing nicotine while using a nicotine replacement product.  RELAPSE OR DIFFICULT SITUATIONS Most relapses occur within the first 3 months after quitting. Do not be discouraged if you start smoking again. Remember, most people try several times before finally quitting. You may have symptoms of withdrawal because your body is used to nicotine. You may crave  cigarettes, be irritable, feel very hungry, cough often, get headaches, or have difficulty concentrating. The withdrawal symptoms are only temporary. They are strongest when you first quit, but they will go away within 10 14 days. To reduce the chances of relapse, try to:  Avoid drinking alcohol. Drinking lowers your chances of successfully quitting.  Reduce the amount of caffeine you consume. Once you quit smoking, the amount of caffeine in your body increases and can give you symptoms, such as a rapid heartbeat, sweating, and anxiety.  Avoid smokers because they can make you want to smoke.  Do not let weight gain distract you. Many smokers will gain weight when they quit, usually less than 10 pounds. Eat a healthy diet and stay active. You can always lose the weight gained after you quit.  Find ways to improve your mood other than smoking. FOR MORE INFORMATION  www.smokefree.gov  Document  Released: 02/27/2001 Document Revised: 09/04/2011 Document Reviewed: 06/14/2011 [facility] Patient Information 2014 [place], [place].

## 2013-04-02 ENCOUNTER — Ambulatory Visit (INDEPENDENT_AMBULATORY_CARE_PROVIDER_SITE_OTHER): Payer: Medicare PPO

## 2013-05-04 ENCOUNTER — Other Ambulatory Visit: Payer: Self-pay | Admitting: Internal Medicine

## 2013-05-04 ENCOUNTER — Ambulatory Visit (INDEPENDENT_AMBULATORY_CARE_PROVIDER_SITE_OTHER): Payer: Medicare PPO | Admitting: Family Medicine

## 2013-05-04 VITALS — BP 111/72 | HR 79 | Temp 98.1°F | Resp 16 | Ht 69.5 in | Wt 124.0 lb

## 2013-05-04 DIAGNOSIS — R269 Unspecified abnormalities of gait and mobility: Secondary | ICD-10-CM

## 2013-05-04 DIAGNOSIS — F329 Major depressive disorder, single episode, unspecified: Secondary | ICD-10-CM

## 2013-05-04 DIAGNOSIS — Z125 Encounter for screening for malignant neoplasm of prostate: Secondary | ICD-10-CM

## 2013-05-04 DIAGNOSIS — F3289 Other specified depressive episodes: Secondary | ICD-10-CM

## 2013-05-04 DIAGNOSIS — D65 Disseminated intravascular coagulation [defibrination syndrome]: Secondary | ICD-10-CM

## 2013-05-04 DIAGNOSIS — H269 Unspecified cataract: Secondary | ICD-10-CM

## 2013-05-04 DIAGNOSIS — F17200 Nicotine dependence, unspecified, uncomplicated: Secondary | ICD-10-CM

## 2013-05-04 DIAGNOSIS — F32A Depression, unspecified: Secondary | ICD-10-CM

## 2013-05-04 DIAGNOSIS — Z0000 Encounter for general adult medical examination without abnormal findings: Secondary | ICD-10-CM

## 2013-05-04 DIAGNOSIS — Z13220 Encounter for screening for lipoid disorders: Secondary | ICD-10-CM

## 2013-05-04 DIAGNOSIS — M1990 Unspecified osteoarthritis, unspecified site: Secondary | ICD-10-CM

## 2013-05-04 DIAGNOSIS — D66 Hereditary factor VIII deficiency: Secondary | ICD-10-CM

## 2013-05-04 MED ORDER — CITALOPRAM HYDROBROMIDE 40 MG PO TABS: 40.0000 mg | ORAL_TABLET | Freq: Every day | ORAL | Status: DC

## 2013-05-04 NOTE — Progress Notes (Signed)
[facility] [address], [place] [address] [phone]  Date:  05/04/2013   Name:  Darren Torres   DOB:  03-Oct-1955   MRN:  [id number]  PCP:  Tally Due, MD    Chief Complaint: Medication Refill   History of Present Illness:  Darren Torres is a 58 y.o. very pleasant male patient who presents with the following:  Here today to establish care with me- he just a partial small bowel resection done at [facility] in January for ischemic bowel.  He was in the hospital for 10 days.  He is eating again now.  Wt Readings from Last 3 Encounters:  05/04/13 124 lb (56.246 kg)  04/02/13 148 lb 3.2 oz (67.223 kg)  11/03/12 141 lb (63.957 kg)   He lost a good bit of weight during this time.  He is still following up in [place].  His wound is healing by secondary intention.   He otherwise has a long history of health problems including hemophilia, hep C, depression and chronic pain.  He needs a rf of his celexa today.   He refused a flu shot.  He does not need anything else today  Patient Active Problem List   Diagnosis Date Noted  . Cataract 04/28/2012  . DJD (degenerative joint disease) 04/28/2012  . Abnormality of gait 04/28/2012  . Hemophilia 05/28/2011  . Hepatitis C 05/28/2011  . Chronic pain 05/28/2011  . Depression 05/28/2011    Past Medical History  Diagnosis Date  . Hemophilia   . Depression   . Hepatitis C   . Chronic kidney disease   . Clotting disorder     Past Surgical History  Procedure Laterality Date  . Joint replacement      History  Substance Use Topics  . Smoking status: Current Every Day Smoker -- 0.50 packs/day    Types: Cigarettes  . Smokeless tobacco: Not on file  . Alcohol Use: No    Family History  Problem Relation Age of Onset  . Arthritis Mother   . Heart disease Father   . Heart disease Maternal Grandmother   . Heart disease Maternal Grandfather   . Cancer Paternal Grandmother   . Heart disease Paternal  Grandfather     Allergies  Allergen Reactions  . Chantix [Varenicline Tartrate] Other (See Comments)    Changes mood  . Wellbutrin [Bupropion Hcl] Palpitations  . Asa Arthritis Strength-Antacid [Aspirin Buffered] Other (See Comments)    Due to blood thinner  . Other     cryopercipate    Medication list has been reviewed and updated.  Current Outpatient Prescriptions on File Prior to Visit  Medication Sig Dispense Refill  . Casanthranol-Docusate Sodium 30-100 MG CAPS Take by mouth.      . citalopram (CELEXA) 40 MG tablet Take 1 tablet (40 mg total) by mouth daily.  90 tablet  2  . lactobacillus acidophilus (BACID) TABS Take 2 tablets by mouth daily.      Marland Kitchen morphine (MSIR) 15 MG tablet Take 15 mg by mouth every 4 (four) hours as needed.       No current facility-administered medications on file prior to visit.    Review of Systems:  As per HPI- otherwise negative.   Physical Examination: Filed Vitals:   05/04/13 0958  BP: 111/72  Pulse: 79  Temp: 98.1 F (36.7 C)  Resp: 16   Filed Vitals:   05/04/13 0958  Height: 5' 9.5" (1.765 m)  Weight: 124 lb (  56.246 kg)   Body mass index is 18.06 kg/(m^2). Ideal Body Weight: Weight in (lb) to have BMI = 25: 171.4  GEN: WDWN, NAD, Non-toxic, A & O x 3, thin, pale, appears chronically ill HEENT: Atraumatic, Normocephalic. Neck supple. No masses, No LAD. Ears and Nose: No external deformity. CV: RRR, No M/G/R. No JVD. No thrill. No extra heart sounds. PULM: CTA B, no wheezes, crackles, rhonchi. No retractions. No resp. distress. No accessory muscle use. ABD: S, ND- he has a healing midline incision over the lower abdomen,  It appears to be healing well EXTR: No c/c/e NEURO Normal gait.  PSYCH: Normally interactive. Conversant. Not depressed or anxious appearing.  Calm demeanor.    Assessment and Plan: Routine general medical examination at a health care facility - Plan: citalopram (CELEXA) 40 MG tablet  Hemophilia - Plan:  citalopram (CELEXA) 40 MG tablet  Special screening for malignant neoplasm of prostate - Plan: citalopram (CELEXA) 40 MG tablet  Screening for lipoid disorders - Plan: citalopram (CELEXA) 40 MG tablet  Cataract - Plan: citalopram (CELEXA) 40 MG tablet  DJD (degenerative joint disease) - Plan: citalopram (CELEXA) 40 MG tablet  Abnormality of gait - Plan: citalopram (CELEXA) 40 MG tablet  Smoker - Plan: citalopram (CELEXA) 40 MG tablet  Depression  Refilled his celexa today.   [place]  Plan to recheck in 2 or 3 months to follow his weight.   Signed Abbe AmsterdamJessica Isaish Alemu, MD

## 2013-05-04 NOTE — Addendum Note (Signed)
Addended by: Abbe AmsterdamOPLAND, JESSICA C on: 05/04/2013 02:12 PM   Modules accepted: Orders

## 2013-05-04 NOTE — Patient Instructions (Signed)
Good to see you today, let's check back in 2 or 3 months to follow-up on your weight.  Let me know if we can help with anything else in the meantime

## 2013-11-09 ENCOUNTER — Encounter: Payer: Self-pay | Admitting: Family Medicine

## 2013-11-09 ENCOUNTER — Ambulatory Visit (INDEPENDENT_AMBULATORY_CARE_PROVIDER_SITE_OTHER): Payer: Commercial Managed Care - HMO | Admitting: Family Medicine

## 2013-11-09 VITALS — BP 109/69 | HR 72 | Temp 98.0°F | Resp 16 | Ht 70.0 in | Wt 138.0 lb

## 2013-11-09 DIAGNOSIS — Z13220 Encounter for screening for lipoid disorders: Secondary | ICD-10-CM | POA: Diagnosis not present

## 2013-11-09 DIAGNOSIS — M6530 Trigger finger, unspecified finger: Secondary | ICD-10-CM

## 2013-11-09 DIAGNOSIS — R634 Abnormal weight loss: Secondary | ICD-10-CM | POA: Diagnosis not present

## 2013-11-09 LAB — LIPID PANEL
CHOLESTEROL: 179 mg/dL (ref 0–200)
HDL: 52 mg/dL (ref >39)
LDL Cholesterol: 107 mg/dL — ABNORMAL HIGH (ref 0–99)
Total CHOL/HDL Ratio: 3.4 Ratio
Triglycerides: 99 mg/dL (ref <150)
VLDL: 20 mg/dL (ref 0–40)

## 2013-11-09 NOTE — Progress Notes (Signed)
[facility] [address], [place] [address] [phone]  Date:  11/09/2013   Name:  Darren Torres   DOB:  09-Oct-1955   MRN:  [id number]  PCP:  Tally Due, MD    Chief Complaint: Follow-up   History of Present Illness:  Darren Torres is a 58 y.o. very pleasant male patient who presents with the following:  Chronic health problems as below.  Last seen by myself in February  Wt Readings from Last 3 Encounters:  11/09/13 138 lb (62.596 kg)  05/04/13 124 lb (56.246 kg)  04/02/13 148 lb 3.2 oz (67.223 kg)   He has gained back most of the weight he lost after his small bowell resection in January.   He is on a new hep C treatment right now- it is one pill a day and he is tolerating at well.  It sounds like he will hopefully be cured.   His hepatologist recently ordered an ultrasound that showed kidney stones, gallstones.  He does not have any pain unless he eats a fatty meal.  He did not appear to have any liver cancer. For the time being they are just watching these findings.   He gives himself factor VIII three times a week for his hemophilia.   He did have some pie this am.    Patient Active Problem List   Diagnosis Date Noted  . Cataract 04/28/2012  . DJD (degenerative joint disease) 04/28/2012  . Abnormality of gait 04/28/2012  . Hemophilia 05/28/2011  . Hepatitis C 05/28/2011  . Chronic pain 05/28/2011  . Depression 05/28/2011    Past Medical History  Diagnosis Date  . Hemophilia   . Depression   . Hepatitis C   . Chronic kidney disease   . Clotting disorder     Past Surgical History  Procedure Laterality Date  . Joint replacement      History  Substance Use Topics  . Smoking status: Current Every Day Smoker -- 0.50 packs/day    Types: Cigarettes  . Smokeless tobacco: Not on file  . Alcohol Use: No    Family History  Problem Relation Age of Onset  . Arthritis Mother   . Heart disease Father   . Heart disease  Maternal Grandmother   . Heart disease Maternal Grandfather   . Cancer Paternal Grandmother   . Heart disease Paternal Grandfather     Allergies  Allergen Reactions  . Chantix [Varenicline Tartrate] Other (See Comments)    Changes mood  . Wellbutrin [Bupropion Hcl] Palpitations  . Asa Arthritis Strength-Antacid [Aspirin Buffered] Other (See Comments)    Due to blood thinner  . Other     cryopercipate    Medication list has been reviewed and updated.  Current Outpatient Prescriptions on File Prior to Visit  Medication Sig Dispense Refill  . Casanthranol-Docusate Sodium 30-100 MG CAPS Take by mouth.      . citalopram (CELEXA) 40 MG tablet Take 1 tablet (40 mg total) by mouth daily.  90 tablet  3  . morphine (MSIR) 15 MG tablet Take 15 mg by mouth every 4 (four) hours as needed.       No current facility-administered medications on file prior to visit.    Review of Systems:  As per HPI- otherwise negative.   Physical Examination: Filed Vitals:   11/09/13 0926  BP: 109/69  Pulse: 72  Temp: 98 F (36.7 C)  Resp: 16   Filed Vitals:   11/09/13 0926  Height:  (1.778 m)  Weight: 138 lb (62.596 kg)   Body mass index is 19.8 kg/(m^2). Ideal Body Weight: Weight in (lb) to have BMI = 25: 173.9  GEN: WDWN, NAD, Non-toxic, A & O x 3, slim build but has gained and looks better HEENT: Atraumatic, Normocephalic. Neck supple. No masses, No LAD. Ears and Nose: No external deformity. CV: RRR, No M/G/R. No JVD. No thrill. No extra heart sounds. PULM: CTA B, no wheezes, crackles, rhonchi. No retractions. No resp. distress. No accessory muscle use. ABD: S, NT, ND, +BS.  Hepatomegaly- stable EXTR: No c/c/e NEURO Normal gait.  PSYCH: Normally interactive. Conversant. Not depressed or anxious appearing.  Calm demeanor.  He has a picc line in the right arm.   Trigger finger right ring finger  Assessment and Plan: Screening for hyperlipidemia - Plan: Lipid panel  Trigger  finger, acquired - Plan: Ambulatory referral to Orthopedic Surgery  Weight loss, abnormal  Referral to ortho for treatment of a troublesome trigger finger.  He has gained weight and is doing better Will plan further follow- up pending labs. He declines a flu shot Send off a records release so we can try and get a report from his last colonoscopy   Signed Abbe Amsterdam, MD

## 2013-11-09 NOTE — Patient Instructions (Signed)
Good to see you today- I will be in touch with your labs.  Double check with your liver/ hematology doctors about the flu shot.  I think you likely SHOULD have this vaccine every year, as long as ok with them.

## 2013-11-10 ENCOUNTER — Encounter: Payer: Self-pay | Admitting: Family Medicine

## 2014-03-19 DIAGNOSIS — R69 Illness, unspecified: Secondary | ICD-10-CM | POA: Diagnosis not present

## 2014-03-31 ENCOUNTER — Telehealth: Payer: Self-pay | Admitting: *Deleted

## 2014-03-31 NOTE — Telephone Encounter (Signed)
[facility] Deniece Port[facility]an Harris (GI) requesting most recent colonoscopy report (Dr. Patsy Lageropland had previously in August 2015 sent request).

## 2014-04-01 NOTE — Telephone Encounter (Signed)
[facility] Jeanie Seweriaz Chowdhury); health maintenance updated and GI added to care team.

## 2014-04-05 ENCOUNTER — Ambulatory Visit (INDEPENDENT_AMBULATORY_CARE_PROVIDER_SITE_OTHER): Payer: Commercial Managed Care - HMO | Admitting: Family Medicine

## 2014-04-05 ENCOUNTER — Encounter: Payer: Self-pay | Admitting: Family Medicine

## 2014-04-05 DIAGNOSIS — Z720 Tobacco use: Secondary | ICD-10-CM | POA: Diagnosis not present

## 2014-04-05 DIAGNOSIS — F329 Major depressive disorder, single episode, unspecified: Secondary | ICD-10-CM | POA: Diagnosis not present

## 2014-04-05 DIAGNOSIS — F32A Depression, unspecified: Secondary | ICD-10-CM

## 2014-04-05 DIAGNOSIS — D66 Hereditary factor VIII deficiency: Secondary | ICD-10-CM

## 2014-04-05 DIAGNOSIS — Z125 Encounter for screening for malignant neoplasm of prostate: Secondary | ICD-10-CM

## 2014-04-05 DIAGNOSIS — F17200 Nicotine dependence, unspecified, uncomplicated: Secondary | ICD-10-CM

## 2014-04-05 MED ORDER — CITALOPRAM HYDROBROMIDE 40 MG PO TABS: 40.0000 mg | ORAL_TABLET | Freq: Every day | ORAL | Status: DC

## 2014-04-05 NOTE — Progress Notes (Signed)
[facility] [address], [place] [id number] [phone]  Date:  04/05/2014   Name:  Darren Torres   DOB:  December 18, 1955   MRN:  [id number]  PCP:  Tally DueGUEST, CHRIS WARREN, MD    Chief Complaint: Follow-up   History of Present Illness:  Darren Torres is a 59 y.o. very pleasant male patient who presents with the following:  Here today to follow-up on his medication.  He finished his Harvoni for Hep C per Deniece PortelaSean Harris-  they plan to do his blood levels tomorrow and in 6 months to see is he is cured He is feeling well.   He is treated with clexa for depression- he feels that this is working well.  Needs a refill He is [facility]- they do his pain medications and treat his hemophilia  He has been eating more, has gained back some weight which is good  He is here with his wife Alexia Freestoneatty who is also being seen today  Wt Readings from Last 3 Encounters:  04/05/14 149 lb (67.586 kg)  11/09/13 138 lb (62.596 kg)  05/04/13 124 lb (56.246 kg)     Patient Active Problem List   Diagnosis Date Noted  . Cataract 04/28/2012  . DJD (degenerative joint disease) 04/28/2012  . Abnormality of gait 04/28/2012  . Hemophilia 05/28/2011  . Hepatitis C 05/28/2011  . Chronic pain 05/28/2011  . Depression 05/28/2011    Past Medical History  Diagnosis Date  . Hemophilia   . Depression   . Hepatitis C   . Chronic kidney disease   . Clotting disorder     Past Surgical History  Procedure Laterality Date  . Joint replacement      History  Substance Use Topics  . Smoking status: Current Every Day Smoker -- 0.50 packs/day    Types: Cigarettes  . Smokeless tobacco: Not on file  . Alcohol Use: No    Family History  Problem Relation Age of Onset  . Arthritis Mother   . Heart disease Father   . Heart disease Maternal Grandmother   . Heart disease Maternal Grandfather   . Cancer Paternal Grandmother   . Heart disease Paternal Grandfather      Allergies  Allergen Reactions  . Chantix [Varenicline Tartrate] Other (See Comments)    Changes mood  . Wellbutrin [Bupropion Hcl] Palpitations  . Asa Arthritis Strength-Antacid [Aspirin Buffered] Other (See Comments)    Due to blood thinner  . Other     cryopercipate    Medication list has been reviewed and updated.  Current Outpatient Prescriptions on File Prior to Visit  Medication Sig Dispense Refill  . Casanthranol-Docusate Sodium 30-100 MG CAPS Take by mouth.    . citalopram (CELEXA) 40 MG tablet Take 1 tablet (40 mg total) by mouth daily. 90 tablet 3  . morphine (KADIAN) 30 MG 24 hr capsule Take 30 mg by mouth daily.    Marland Kitchen. morphine (MSIR) 15 MG tablet Take 15 mg by mouth every 4 (four) hours as needed.     No current facility-administered medications on file prior to visit.    Review of Systems:  As per HPI- otherwise negative.   Physical Examination: Filed Vitals:   04/05/14 1019  BP: 130/84  Pulse: 60  Temp: 97.8 F (36.6 C)  Resp: 16   Filed Vitals:   04/05/14 1019  Height: 5\' 10"  (1.778 m)  Weight: 149 lb (67.586 kg)   Body mass  index is 21.38 kg/(m^2). Ideal Body Weight: Weight in (lb) to have BMI = 25: 173.9  GEN: WDWN, NAD, Non-toxic, A & O x 3, looks well, weight is much better HEENT: Atraumatic, Normocephalic. Neck supple. No masses, No LAD. Ears and Nose: No external deformity. CV: RRR, No M/G/R. No JVD. No thrill. No extra heart sounds. PULM: CTA B, no wheezes, crackles, rhonchi. No retractions. No resp. distress. No accessory muscle use. EXTR: No c/c/e NEURO Normal gait.  PSYCH: Normally interactive. Conversant. Not depressed or anxious appearing.  Calm demeanor.  He has gained back some weight and looks much better  Assessment and Plan: Hemophilia  Special screening for malignant neoplasm of prostate  Smoker  Depression - Plan: citalopram (CELEXA) 40 MG tablet  Refilled his celexa today- there is nothing else that he needs for  [place] to do now.  He plans to come in for a CPE over the summer.  He is otherwise getting regular labs per his various specialists.   Signed Abbe Amsterdam, MD

## 2014-04-06 DIAGNOSIS — K7469 Other cirrhosis of liver: Secondary | ICD-10-CM | POA: Diagnosis not present

## 2014-04-06 DIAGNOSIS — B182 Chronic viral hepatitis C: Secondary | ICD-10-CM | POA: Diagnosis not present

## 2014-04-06 DIAGNOSIS — B1920 Unspecified viral hepatitis C without hepatic coma: Secondary | ICD-10-CM | POA: Diagnosis not present

## 2014-04-15 DIAGNOSIS — B1920 Unspecified viral hepatitis C without hepatic coma: Secondary | ICD-10-CM | POA: Diagnosis not present

## 2014-04-15 DIAGNOSIS — N281 Cyst of kidney, acquired: Secondary | ICD-10-CM | POA: Diagnosis not present

## 2014-04-15 DIAGNOSIS — K7460 Unspecified cirrhosis of liver: Secondary | ICD-10-CM | POA: Diagnosis not present

## 2014-04-15 DIAGNOSIS — K8020 Calculus of gallbladder without cholecystitis without obstruction: Secondary | ICD-10-CM | POA: Diagnosis not present

## 2014-04-29 ENCOUNTER — Telehealth: Payer: Self-pay

## 2014-04-29 NOTE — Telephone Encounter (Signed)
Called him back.  He reports that I need to order his home health nurse to come out and change to picc line dressing.  It seems that his insurance company needs an order from me, not the MD who ordered the PICC originally  Vilma PraderAmanda Nelson with [facility] has been his nurse.  [phone] Needs appts weekly for a year.  Spoke with Marchelle Folksmanda and got instructions, will fax order to [phone]- admit pt to home care for weekly picc line care for one year

## 2014-04-29 NOTE — Telephone Encounter (Signed)
Pt called in today stating that he wanted to talk to Dr. Patsy Lageropland about his PICC line because he was having issues with the home health nurse that is coming in to take care of his line. I asked the patient if he had notified the home health agency about the nurse and he then stated that he had not because he wasn't having problems with the nurse like that. He then stated to me that he was having trouble his the insurance company and his referrals about the PICC line. He kept asking to speak directly to Dr. Patsy Lageropland, I informed him that she was seeing patients and that i would have to put a message in for her before someone would call him back he said that was fine and to put in everything he had just told me. I tried to clarify what was wrong with the PICC line if anything and he would not tell me. He just wants to speak to the doctor.   He would like to be called back at [phone].

## 2014-05-14 DIAGNOSIS — K529 Noninfective gastroenteritis and colitis, unspecified: Secondary | ICD-10-CM | POA: Diagnosis not present

## 2014-05-14 DIAGNOSIS — M6281 Muscle weakness (generalized): Secondary | ICD-10-CM | POA: Diagnosis not present

## 2014-05-14 DIAGNOSIS — F29 Unspecified psychosis not due to a substance or known physiological condition: Secondary | ICD-10-CM | POA: Diagnosis not present

## 2014-05-14 DIAGNOSIS — D66 Hereditary factor VIII deficiency: Secondary | ICD-10-CM | POA: Diagnosis not present

## 2014-05-14 DIAGNOSIS — K659 Peritonitis, unspecified: Secondary | ICD-10-CM | POA: Diagnosis not present

## 2014-05-14 DIAGNOSIS — M00062 Staphylococcal arthritis, left knee: Secondary | ICD-10-CM | POA: Diagnosis not present

## 2014-05-14 DIAGNOSIS — K580 Irritable bowel syndrome with diarrhea: Secondary | ICD-10-CM | POA: Diagnosis not present

## 2014-05-15 DIAGNOSIS — K529 Noninfective gastroenteritis and colitis, unspecified: Secondary | ICD-10-CM | POA: Diagnosis not present

## 2014-05-15 DIAGNOSIS — K580 Irritable bowel syndrome with diarrhea: Secondary | ICD-10-CM | POA: Diagnosis not present

## 2014-05-15 DIAGNOSIS — K659 Peritonitis, unspecified: Secondary | ICD-10-CM | POA: Diagnosis not present

## 2014-05-15 DIAGNOSIS — F29 Unspecified psychosis not due to a substance or known physiological condition: Secondary | ICD-10-CM | POA: Diagnosis not present

## 2014-05-15 DIAGNOSIS — M00062 Staphylococcal arthritis, left knee: Secondary | ICD-10-CM | POA: Diagnosis not present

## 2014-05-15 DIAGNOSIS — D66 Hereditary factor VIII deficiency: Secondary | ICD-10-CM | POA: Diagnosis not present

## 2014-05-15 DIAGNOSIS — M6281 Muscle weakness (generalized): Secondary | ICD-10-CM | POA: Diagnosis not present

## 2014-05-21 DIAGNOSIS — D66 Hereditary factor VIII deficiency: Secondary | ICD-10-CM | POA: Diagnosis not present

## 2014-05-21 DIAGNOSIS — M6281 Muscle weakness (generalized): Secondary | ICD-10-CM | POA: Diagnosis not present

## 2014-05-21 DIAGNOSIS — F29 Unspecified psychosis not due to a substance or known physiological condition: Secondary | ICD-10-CM | POA: Diagnosis not present

## 2014-05-21 DIAGNOSIS — K529 Noninfective gastroenteritis and colitis, unspecified: Secondary | ICD-10-CM | POA: Diagnosis not present

## 2014-05-21 DIAGNOSIS — K659 Peritonitis, unspecified: Secondary | ICD-10-CM | POA: Diagnosis not present

## 2014-05-21 DIAGNOSIS — K580 Irritable bowel syndrome with diarrhea: Secondary | ICD-10-CM | POA: Diagnosis not present

## 2014-05-21 DIAGNOSIS — M00062 Staphylococcal arthritis, left knee: Secondary | ICD-10-CM | POA: Diagnosis not present

## 2014-05-22 DIAGNOSIS — M6281 Muscle weakness (generalized): Secondary | ICD-10-CM | POA: Diagnosis not present

## 2014-05-22 DIAGNOSIS — K529 Noninfective gastroenteritis and colitis, unspecified: Secondary | ICD-10-CM | POA: Diagnosis not present

## 2014-05-22 DIAGNOSIS — F29 Unspecified psychosis not due to a substance or known physiological condition: Secondary | ICD-10-CM | POA: Diagnosis not present

## 2014-05-22 DIAGNOSIS — M00062 Staphylococcal arthritis, left knee: Secondary | ICD-10-CM | POA: Diagnosis not present

## 2014-05-22 DIAGNOSIS — D66 Hereditary factor VIII deficiency: Secondary | ICD-10-CM | POA: Diagnosis not present

## 2014-05-22 DIAGNOSIS — K580 Irritable bowel syndrome with diarrhea: Secondary | ICD-10-CM | POA: Diagnosis not present

## 2014-05-22 DIAGNOSIS — K659 Peritonitis, unspecified: Secondary | ICD-10-CM | POA: Diagnosis not present

## 2014-05-28 DIAGNOSIS — K529 Noninfective gastroenteritis and colitis, unspecified: Secondary | ICD-10-CM | POA: Diagnosis not present

## 2014-05-28 DIAGNOSIS — D66 Hereditary factor VIII deficiency: Secondary | ICD-10-CM | POA: Diagnosis not present

## 2014-05-28 DIAGNOSIS — K659 Peritonitis, unspecified: Secondary | ICD-10-CM | POA: Diagnosis not present

## 2014-05-28 DIAGNOSIS — F29 Unspecified psychosis not due to a substance or known physiological condition: Secondary | ICD-10-CM | POA: Diagnosis not present

## 2014-05-28 DIAGNOSIS — K580 Irritable bowel syndrome with diarrhea: Secondary | ICD-10-CM | POA: Diagnosis not present

## 2014-05-28 DIAGNOSIS — M00062 Staphylococcal arthritis, left knee: Secondary | ICD-10-CM | POA: Diagnosis not present

## 2014-05-28 DIAGNOSIS — M6281 Muscle weakness (generalized): Secondary | ICD-10-CM | POA: Diagnosis not present

## 2014-05-29 DIAGNOSIS — K529 Noninfective gastroenteritis and colitis, unspecified: Secondary | ICD-10-CM | POA: Diagnosis not present

## 2014-05-29 DIAGNOSIS — D66 Hereditary factor VIII deficiency: Secondary | ICD-10-CM | POA: Diagnosis not present

## 2014-05-29 DIAGNOSIS — F29 Unspecified psychosis not due to a substance or known physiological condition: Secondary | ICD-10-CM | POA: Diagnosis not present

## 2014-05-29 DIAGNOSIS — K659 Peritonitis, unspecified: Secondary | ICD-10-CM | POA: Diagnosis not present

## 2014-05-29 DIAGNOSIS — M00062 Staphylococcal arthritis, left knee: Secondary | ICD-10-CM | POA: Diagnosis not present

## 2014-05-29 DIAGNOSIS — M6281 Muscle weakness (generalized): Secondary | ICD-10-CM | POA: Diagnosis not present

## 2014-05-29 DIAGNOSIS — K580 Irritable bowel syndrome with diarrhea: Secondary | ICD-10-CM | POA: Diagnosis not present

## 2014-06-04 DIAGNOSIS — K580 Irritable bowel syndrome with diarrhea: Secondary | ICD-10-CM | POA: Diagnosis not present

## 2014-06-04 DIAGNOSIS — K529 Noninfective gastroenteritis and colitis, unspecified: Secondary | ICD-10-CM | POA: Diagnosis not present

## 2014-06-04 DIAGNOSIS — M6281 Muscle weakness (generalized): Secondary | ICD-10-CM | POA: Diagnosis not present

## 2014-06-04 DIAGNOSIS — F29 Unspecified psychosis not due to a substance or known physiological condition: Secondary | ICD-10-CM | POA: Diagnosis not present

## 2014-06-04 DIAGNOSIS — M00062 Staphylococcal arthritis, left knee: Secondary | ICD-10-CM | POA: Diagnosis not present

## 2014-06-04 DIAGNOSIS — K659 Peritonitis, unspecified: Secondary | ICD-10-CM | POA: Diagnosis not present

## 2014-06-04 DIAGNOSIS — D66 Hereditary factor VIII deficiency: Secondary | ICD-10-CM | POA: Diagnosis not present

## 2014-06-05 DIAGNOSIS — F29 Unspecified psychosis not due to a substance or known physiological condition: Secondary | ICD-10-CM | POA: Diagnosis not present

## 2014-06-05 DIAGNOSIS — M6281 Muscle weakness (generalized): Secondary | ICD-10-CM | POA: Diagnosis not present

## 2014-06-05 DIAGNOSIS — M00062 Staphylococcal arthritis, left knee: Secondary | ICD-10-CM | POA: Diagnosis not present

## 2014-06-05 DIAGNOSIS — D66 Hereditary factor VIII deficiency: Secondary | ICD-10-CM | POA: Diagnosis not present

## 2014-06-05 DIAGNOSIS — K529 Noninfective gastroenteritis and colitis, unspecified: Secondary | ICD-10-CM | POA: Diagnosis not present

## 2014-06-05 DIAGNOSIS — K580 Irritable bowel syndrome with diarrhea: Secondary | ICD-10-CM | POA: Diagnosis not present

## 2014-06-05 DIAGNOSIS — K659 Peritonitis, unspecified: Secondary | ICD-10-CM | POA: Diagnosis not present

## 2014-06-10 DIAGNOSIS — M6281 Muscle weakness (generalized): Secondary | ICD-10-CM | POA: Diagnosis not present

## 2014-06-10 DIAGNOSIS — K580 Irritable bowel syndrome with diarrhea: Secondary | ICD-10-CM | POA: Diagnosis not present

## 2014-06-10 DIAGNOSIS — K529 Noninfective gastroenteritis and colitis, unspecified: Secondary | ICD-10-CM | POA: Diagnosis not present

## 2014-06-10 DIAGNOSIS — K659 Peritonitis, unspecified: Secondary | ICD-10-CM | POA: Diagnosis not present

## 2014-06-10 DIAGNOSIS — D66 Hereditary factor VIII deficiency: Secondary | ICD-10-CM | POA: Diagnosis not present

## 2014-06-10 DIAGNOSIS — F29 Unspecified psychosis not due to a substance or known physiological condition: Secondary | ICD-10-CM | POA: Diagnosis not present

## 2014-06-10 DIAGNOSIS — M00062 Staphylococcal arthritis, left knee: Secondary | ICD-10-CM | POA: Diagnosis not present

## 2014-06-12 DIAGNOSIS — K659 Peritonitis, unspecified: Secondary | ICD-10-CM | POA: Diagnosis not present

## 2014-06-12 DIAGNOSIS — M00062 Staphylococcal arthritis, left knee: Secondary | ICD-10-CM | POA: Diagnosis not present

## 2014-06-12 DIAGNOSIS — K580 Irritable bowel syndrome with diarrhea: Secondary | ICD-10-CM | POA: Diagnosis not present

## 2014-06-12 DIAGNOSIS — D66 Hereditary factor VIII deficiency: Secondary | ICD-10-CM | POA: Diagnosis not present

## 2014-06-12 DIAGNOSIS — M6281 Muscle weakness (generalized): Secondary | ICD-10-CM | POA: Diagnosis not present

## 2014-06-12 DIAGNOSIS — F29 Unspecified psychosis not due to a substance or known physiological condition: Secondary | ICD-10-CM | POA: Diagnosis not present

## 2014-06-12 DIAGNOSIS — K529 Noninfective gastroenteritis and colitis, unspecified: Secondary | ICD-10-CM | POA: Diagnosis not present

## 2014-06-18 DIAGNOSIS — M00062 Staphylococcal arthritis, left knee: Secondary | ICD-10-CM | POA: Diagnosis not present

## 2014-06-18 DIAGNOSIS — F29 Unspecified psychosis not due to a substance or known physiological condition: Secondary | ICD-10-CM | POA: Diagnosis not present

## 2014-06-18 DIAGNOSIS — K659 Peritonitis, unspecified: Secondary | ICD-10-CM | POA: Diagnosis not present

## 2014-06-18 DIAGNOSIS — K580 Irritable bowel syndrome with diarrhea: Secondary | ICD-10-CM | POA: Diagnosis not present

## 2014-06-18 DIAGNOSIS — D66 Hereditary factor VIII deficiency: Secondary | ICD-10-CM | POA: Diagnosis not present

## 2014-06-18 DIAGNOSIS — M6281 Muscle weakness (generalized): Secondary | ICD-10-CM | POA: Diagnosis not present

## 2014-06-18 DIAGNOSIS — K529 Noninfective gastroenteritis and colitis, unspecified: Secondary | ICD-10-CM | POA: Diagnosis not present

## 2014-06-19 DIAGNOSIS — F29 Unspecified psychosis not due to a substance or known physiological condition: Secondary | ICD-10-CM | POA: Diagnosis not present

## 2014-06-19 DIAGNOSIS — K580 Irritable bowel syndrome with diarrhea: Secondary | ICD-10-CM | POA: Diagnosis not present

## 2014-06-19 DIAGNOSIS — M00062 Staphylococcal arthritis, left knee: Secondary | ICD-10-CM | POA: Diagnosis not present

## 2014-06-19 DIAGNOSIS — D66 Hereditary factor VIII deficiency: Secondary | ICD-10-CM | POA: Diagnosis not present

## 2014-06-19 DIAGNOSIS — M6281 Muscle weakness (generalized): Secondary | ICD-10-CM | POA: Diagnosis not present

## 2014-06-19 DIAGNOSIS — K529 Noninfective gastroenteritis and colitis, unspecified: Secondary | ICD-10-CM | POA: Diagnosis not present

## 2014-06-19 DIAGNOSIS — K659 Peritonitis, unspecified: Secondary | ICD-10-CM | POA: Diagnosis not present

## 2014-06-25 DIAGNOSIS — M6281 Muscle weakness (generalized): Secondary | ICD-10-CM | POA: Diagnosis not present

## 2014-06-25 DIAGNOSIS — D66 Hereditary factor VIII deficiency: Secondary | ICD-10-CM | POA: Diagnosis not present

## 2014-06-25 DIAGNOSIS — F29 Unspecified psychosis not due to a substance or known physiological condition: Secondary | ICD-10-CM | POA: Diagnosis not present

## 2014-06-25 DIAGNOSIS — K659 Peritonitis, unspecified: Secondary | ICD-10-CM | POA: Diagnosis not present

## 2014-06-25 DIAGNOSIS — M00062 Staphylococcal arthritis, left knee: Secondary | ICD-10-CM | POA: Diagnosis not present

## 2014-06-25 DIAGNOSIS — K580 Irritable bowel syndrome with diarrhea: Secondary | ICD-10-CM | POA: Diagnosis not present

## 2014-06-25 DIAGNOSIS — K529 Noninfective gastroenteritis and colitis, unspecified: Secondary | ICD-10-CM | POA: Diagnosis not present

## 2014-06-26 DIAGNOSIS — K529 Noninfective gastroenteritis and colitis, unspecified: Secondary | ICD-10-CM | POA: Diagnosis not present

## 2014-06-26 DIAGNOSIS — F29 Unspecified psychosis not due to a substance or known physiological condition: Secondary | ICD-10-CM | POA: Diagnosis not present

## 2014-06-26 DIAGNOSIS — K659 Peritonitis, unspecified: Secondary | ICD-10-CM | POA: Diagnosis not present

## 2014-06-26 DIAGNOSIS — K580 Irritable bowel syndrome with diarrhea: Secondary | ICD-10-CM | POA: Diagnosis not present

## 2014-06-26 DIAGNOSIS — M6281 Muscle weakness (generalized): Secondary | ICD-10-CM | POA: Diagnosis not present

## 2014-06-26 DIAGNOSIS — D66 Hereditary factor VIII deficiency: Secondary | ICD-10-CM | POA: Diagnosis not present

## 2014-06-26 DIAGNOSIS — M00062 Staphylococcal arthritis, left knee: Secondary | ICD-10-CM | POA: Diagnosis not present

## 2014-07-02 DIAGNOSIS — K580 Irritable bowel syndrome with diarrhea: Secondary | ICD-10-CM | POA: Diagnosis not present

## 2014-07-02 DIAGNOSIS — K529 Noninfective gastroenteritis and colitis, unspecified: Secondary | ICD-10-CM | POA: Diagnosis not present

## 2014-07-02 DIAGNOSIS — K659 Peritonitis, unspecified: Secondary | ICD-10-CM | POA: Diagnosis not present

## 2014-07-02 DIAGNOSIS — D66 Hereditary factor VIII deficiency: Secondary | ICD-10-CM | POA: Diagnosis not present

## 2014-07-02 DIAGNOSIS — M6281 Muscle weakness (generalized): Secondary | ICD-10-CM | POA: Diagnosis not present

## 2014-07-02 DIAGNOSIS — F29 Unspecified psychosis not due to a substance or known physiological condition: Secondary | ICD-10-CM | POA: Diagnosis not present

## 2014-07-02 DIAGNOSIS — M00062 Staphylococcal arthritis, left knee: Secondary | ICD-10-CM | POA: Diagnosis not present

## 2014-07-03 DIAGNOSIS — K659 Peritonitis, unspecified: Secondary | ICD-10-CM | POA: Diagnosis not present

## 2014-07-03 DIAGNOSIS — M00062 Staphylococcal arthritis, left knee: Secondary | ICD-10-CM | POA: Diagnosis not present

## 2014-07-03 DIAGNOSIS — K580 Irritable bowel syndrome with diarrhea: Secondary | ICD-10-CM | POA: Diagnosis not present

## 2014-07-03 DIAGNOSIS — M6281 Muscle weakness (generalized): Secondary | ICD-10-CM | POA: Diagnosis not present

## 2014-07-03 DIAGNOSIS — F29 Unspecified psychosis not due to a substance or known physiological condition: Secondary | ICD-10-CM | POA: Diagnosis not present

## 2014-07-03 DIAGNOSIS — K529 Noninfective gastroenteritis and colitis, unspecified: Secondary | ICD-10-CM | POA: Diagnosis not present

## 2014-07-03 DIAGNOSIS — D66 Hereditary factor VIII deficiency: Secondary | ICD-10-CM | POA: Diagnosis not present

## 2014-07-09 DIAGNOSIS — M6281 Muscle weakness (generalized): Secondary | ICD-10-CM | POA: Diagnosis not present

## 2014-07-09 DIAGNOSIS — K659 Peritonitis, unspecified: Secondary | ICD-10-CM | POA: Diagnosis not present

## 2014-07-09 DIAGNOSIS — F29 Unspecified psychosis not due to a substance or known physiological condition: Secondary | ICD-10-CM | POA: Diagnosis not present

## 2014-07-09 DIAGNOSIS — K580 Irritable bowel syndrome with diarrhea: Secondary | ICD-10-CM | POA: Diagnosis not present

## 2014-07-09 DIAGNOSIS — M00062 Staphylococcal arthritis, left knee: Secondary | ICD-10-CM | POA: Diagnosis not present

## 2014-07-09 DIAGNOSIS — D66 Hereditary factor VIII deficiency: Secondary | ICD-10-CM | POA: Diagnosis not present

## 2014-07-09 DIAGNOSIS — K529 Noninfective gastroenteritis and colitis, unspecified: Secondary | ICD-10-CM | POA: Diagnosis not present

## 2014-07-10 DIAGNOSIS — K580 Irritable bowel syndrome with diarrhea: Secondary | ICD-10-CM | POA: Diagnosis not present

## 2014-07-10 DIAGNOSIS — M6281 Muscle weakness (generalized): Secondary | ICD-10-CM | POA: Diagnosis not present

## 2014-07-10 DIAGNOSIS — D66 Hereditary factor VIII deficiency: Secondary | ICD-10-CM | POA: Diagnosis not present

## 2014-07-10 DIAGNOSIS — K659 Peritonitis, unspecified: Secondary | ICD-10-CM | POA: Diagnosis not present

## 2014-07-10 DIAGNOSIS — F29 Unspecified psychosis not due to a substance or known physiological condition: Secondary | ICD-10-CM | POA: Diagnosis not present

## 2014-07-10 DIAGNOSIS — K529 Noninfective gastroenteritis and colitis, unspecified: Secondary | ICD-10-CM | POA: Diagnosis not present

## 2014-07-10 DIAGNOSIS — M00062 Staphylococcal arthritis, left knee: Secondary | ICD-10-CM | POA: Diagnosis not present

## 2014-07-16 DIAGNOSIS — K580 Irritable bowel syndrome with diarrhea: Secondary | ICD-10-CM | POA: Diagnosis not present

## 2014-07-16 DIAGNOSIS — K659 Peritonitis, unspecified: Secondary | ICD-10-CM | POA: Diagnosis not present

## 2014-07-16 DIAGNOSIS — M6281 Muscle weakness (generalized): Secondary | ICD-10-CM | POA: Diagnosis not present

## 2014-07-16 DIAGNOSIS — M00062 Staphylococcal arthritis, left knee: Secondary | ICD-10-CM | POA: Diagnosis not present

## 2014-07-16 DIAGNOSIS — K529 Noninfective gastroenteritis and colitis, unspecified: Secondary | ICD-10-CM | POA: Diagnosis not present

## 2014-07-16 DIAGNOSIS — F29 Unspecified psychosis not due to a substance or known physiological condition: Secondary | ICD-10-CM | POA: Diagnosis not present

## 2014-07-16 DIAGNOSIS — D66 Hereditary factor VIII deficiency: Secondary | ICD-10-CM | POA: Diagnosis not present

## 2014-07-17 DIAGNOSIS — K529 Noninfective gastroenteritis and colitis, unspecified: Secondary | ICD-10-CM | POA: Diagnosis not present

## 2014-07-17 DIAGNOSIS — K659 Peritonitis, unspecified: Secondary | ICD-10-CM | POA: Diagnosis not present

## 2014-07-17 DIAGNOSIS — M6281 Muscle weakness (generalized): Secondary | ICD-10-CM | POA: Diagnosis not present

## 2014-07-17 DIAGNOSIS — M00062 Staphylococcal arthritis, left knee: Secondary | ICD-10-CM | POA: Diagnosis not present

## 2014-07-17 DIAGNOSIS — F29 Unspecified psychosis not due to a substance or known physiological condition: Secondary | ICD-10-CM | POA: Diagnosis not present

## 2014-07-17 DIAGNOSIS — K580 Irritable bowel syndrome with diarrhea: Secondary | ICD-10-CM | POA: Diagnosis not present

## 2014-07-17 DIAGNOSIS — D66 Hereditary factor VIII deficiency: Secondary | ICD-10-CM | POA: Diagnosis not present

## 2014-07-23 DIAGNOSIS — K580 Irritable bowel syndrome with diarrhea: Secondary | ICD-10-CM | POA: Diagnosis not present

## 2014-07-23 DIAGNOSIS — M6281 Muscle weakness (generalized): Secondary | ICD-10-CM | POA: Diagnosis not present

## 2014-07-23 DIAGNOSIS — K659 Peritonitis, unspecified: Secondary | ICD-10-CM | POA: Diagnosis not present

## 2014-07-23 DIAGNOSIS — K529 Noninfective gastroenteritis and colitis, unspecified: Secondary | ICD-10-CM | POA: Diagnosis not present

## 2014-07-23 DIAGNOSIS — D66 Hereditary factor VIII deficiency: Secondary | ICD-10-CM | POA: Diagnosis not present

## 2014-07-23 DIAGNOSIS — M00062 Staphylococcal arthritis, left knee: Secondary | ICD-10-CM | POA: Diagnosis not present

## 2014-07-23 DIAGNOSIS — F29 Unspecified psychosis not due to a substance or known physiological condition: Secondary | ICD-10-CM | POA: Diagnosis not present

## 2014-07-24 DIAGNOSIS — M6281 Muscle weakness (generalized): Secondary | ICD-10-CM | POA: Diagnosis not present

## 2014-07-24 DIAGNOSIS — M00062 Staphylococcal arthritis, left knee: Secondary | ICD-10-CM | POA: Diagnosis not present

## 2014-07-24 DIAGNOSIS — K529 Noninfective gastroenteritis and colitis, unspecified: Secondary | ICD-10-CM | POA: Diagnosis not present

## 2014-07-24 DIAGNOSIS — K659 Peritonitis, unspecified: Secondary | ICD-10-CM | POA: Diagnosis not present

## 2014-07-24 DIAGNOSIS — K580 Irritable bowel syndrome with diarrhea: Secondary | ICD-10-CM | POA: Diagnosis not present

## 2014-07-24 DIAGNOSIS — F29 Unspecified psychosis not due to a substance or known physiological condition: Secondary | ICD-10-CM | POA: Diagnosis not present

## 2014-07-24 DIAGNOSIS — D66 Hereditary factor VIII deficiency: Secondary | ICD-10-CM | POA: Diagnosis not present

## 2014-07-30 DIAGNOSIS — D66 Hereditary factor VIII deficiency: Secondary | ICD-10-CM | POA: Diagnosis not present

## 2014-07-30 DIAGNOSIS — K529 Noninfective gastroenteritis and colitis, unspecified: Secondary | ICD-10-CM | POA: Diagnosis not present

## 2014-07-30 DIAGNOSIS — K659 Peritonitis, unspecified: Secondary | ICD-10-CM | POA: Diagnosis not present

## 2014-07-30 DIAGNOSIS — M00062 Staphylococcal arthritis, left knee: Secondary | ICD-10-CM | POA: Diagnosis not present

## 2014-07-30 DIAGNOSIS — M6281 Muscle weakness (generalized): Secondary | ICD-10-CM | POA: Diagnosis not present

## 2014-07-30 DIAGNOSIS — F29 Unspecified psychosis not due to a substance or known physiological condition: Secondary | ICD-10-CM | POA: Diagnosis not present

## 2014-07-30 DIAGNOSIS — K580 Irritable bowel syndrome with diarrhea: Secondary | ICD-10-CM | POA: Diagnosis not present

## 2014-07-31 DIAGNOSIS — D66 Hereditary factor VIII deficiency: Secondary | ICD-10-CM | POA: Diagnosis not present

## 2014-07-31 DIAGNOSIS — M00062 Staphylococcal arthritis, left knee: Secondary | ICD-10-CM | POA: Diagnosis not present

## 2014-07-31 DIAGNOSIS — M6281 Muscle weakness (generalized): Secondary | ICD-10-CM | POA: Diagnosis not present

## 2014-07-31 DIAGNOSIS — K580 Irritable bowel syndrome with diarrhea: Secondary | ICD-10-CM | POA: Diagnosis not present

## 2014-07-31 DIAGNOSIS — F29 Unspecified psychosis not due to a substance or known physiological condition: Secondary | ICD-10-CM | POA: Diagnosis not present

## 2014-07-31 DIAGNOSIS — K529 Noninfective gastroenteritis and colitis, unspecified: Secondary | ICD-10-CM | POA: Diagnosis not present

## 2014-07-31 DIAGNOSIS — K659 Peritonitis, unspecified: Secondary | ICD-10-CM | POA: Diagnosis not present

## 2014-08-06 DIAGNOSIS — F29 Unspecified psychosis not due to a substance or known physiological condition: Secondary | ICD-10-CM | POA: Diagnosis not present

## 2014-08-06 DIAGNOSIS — K659 Peritonitis, unspecified: Secondary | ICD-10-CM | POA: Diagnosis not present

## 2014-08-06 DIAGNOSIS — K580 Irritable bowel syndrome with diarrhea: Secondary | ICD-10-CM | POA: Diagnosis not present

## 2014-08-06 DIAGNOSIS — M00062 Staphylococcal arthritis, left knee: Secondary | ICD-10-CM | POA: Diagnosis not present

## 2014-08-06 DIAGNOSIS — M6281 Muscle weakness (generalized): Secondary | ICD-10-CM | POA: Diagnosis not present

## 2014-08-06 DIAGNOSIS — K529 Noninfective gastroenteritis and colitis, unspecified: Secondary | ICD-10-CM | POA: Diagnosis not present

## 2014-08-06 DIAGNOSIS — D66 Hereditary factor VIII deficiency: Secondary | ICD-10-CM | POA: Diagnosis not present

## 2014-08-11 DIAGNOSIS — Z888 Allergy status to other drugs, medicaments and biological substances status: Secondary | ICD-10-CM | POA: Diagnosis not present

## 2014-08-11 DIAGNOSIS — D66 Hereditary factor VIII deficiency: Secondary | ICD-10-CM | POA: Diagnosis not present

## 2014-08-11 DIAGNOSIS — R52 Pain, unspecified: Secondary | ICD-10-CM | POA: Diagnosis not present

## 2014-08-11 DIAGNOSIS — F458 Other somatoform disorders: Secondary | ICD-10-CM | POA: Diagnosis not present

## 2014-08-11 DIAGNOSIS — D759 Disease of blood and blood-forming organs, unspecified: Secondary | ICD-10-CM | POA: Diagnosis not present

## 2014-08-11 DIAGNOSIS — Z886 Allergy status to analgesic agent status: Secondary | ICD-10-CM | POA: Diagnosis not present

## 2014-08-11 DIAGNOSIS — M363 Arthropathy in other blood disorders: Secondary | ICD-10-CM | POA: Diagnosis not present

## 2014-08-11 DIAGNOSIS — G894 Chronic pain syndrome: Secondary | ICD-10-CM | POA: Diagnosis not present

## 2014-08-11 DIAGNOSIS — M362 Hemophilic arthropathy: Secondary | ICD-10-CM | POA: Diagnosis not present

## 2014-08-11 DIAGNOSIS — B1710 Acute hepatitis C without hepatic coma: Secondary | ICD-10-CM | POA: Diagnosis not present

## 2014-08-11 DIAGNOSIS — Z79899 Other long term (current) drug therapy: Secondary | ICD-10-CM | POA: Diagnosis not present

## 2014-08-14 DIAGNOSIS — D66 Hereditary factor VIII deficiency: Secondary | ICD-10-CM | POA: Diagnosis not present

## 2014-08-14 DIAGNOSIS — M00062 Staphylococcal arthritis, left knee: Secondary | ICD-10-CM | POA: Diagnosis not present

## 2014-08-14 DIAGNOSIS — M6281 Muscle weakness (generalized): Secondary | ICD-10-CM | POA: Diagnosis not present

## 2014-08-14 DIAGNOSIS — K529 Noninfective gastroenteritis and colitis, unspecified: Secondary | ICD-10-CM | POA: Diagnosis not present

## 2014-08-14 DIAGNOSIS — K580 Irritable bowel syndrome with diarrhea: Secondary | ICD-10-CM | POA: Diagnosis not present

## 2014-08-14 DIAGNOSIS — K659 Peritonitis, unspecified: Secondary | ICD-10-CM | POA: Diagnosis not present

## 2014-08-14 DIAGNOSIS — F29 Unspecified psychosis not due to a substance or known physiological condition: Secondary | ICD-10-CM | POA: Diagnosis not present

## 2014-08-19 DIAGNOSIS — F29 Unspecified psychosis not due to a substance or known physiological condition: Secondary | ICD-10-CM | POA: Diagnosis not present

## 2014-08-19 DIAGNOSIS — K580 Irritable bowel syndrome with diarrhea: Secondary | ICD-10-CM | POA: Diagnosis not present

## 2014-08-19 DIAGNOSIS — K529 Noninfective gastroenteritis and colitis, unspecified: Secondary | ICD-10-CM | POA: Diagnosis not present

## 2014-08-19 DIAGNOSIS — K659 Peritonitis, unspecified: Secondary | ICD-10-CM | POA: Diagnosis not present

## 2014-08-19 DIAGNOSIS — M6281 Muscle weakness (generalized): Secondary | ICD-10-CM | POA: Diagnosis not present

## 2014-08-19 DIAGNOSIS — M00062 Staphylococcal arthritis, left knee: Secondary | ICD-10-CM | POA: Diagnosis not present

## 2014-08-19 DIAGNOSIS — D66 Hereditary factor VIII deficiency: Secondary | ICD-10-CM | POA: Diagnosis not present

## 2014-08-21 DIAGNOSIS — M00062 Staphylococcal arthritis, left knee: Secondary | ICD-10-CM | POA: Diagnosis not present

## 2014-08-21 DIAGNOSIS — D66 Hereditary factor VIII deficiency: Secondary | ICD-10-CM | POA: Diagnosis not present

## 2014-08-21 DIAGNOSIS — M6281 Muscle weakness (generalized): Secondary | ICD-10-CM | POA: Diagnosis not present

## 2014-08-21 DIAGNOSIS — K580 Irritable bowel syndrome with diarrhea: Secondary | ICD-10-CM | POA: Diagnosis not present

## 2014-08-21 DIAGNOSIS — K659 Peritonitis, unspecified: Secondary | ICD-10-CM | POA: Diagnosis not present

## 2014-08-21 DIAGNOSIS — K529 Noninfective gastroenteritis and colitis, unspecified: Secondary | ICD-10-CM | POA: Diagnosis not present

## 2014-08-21 DIAGNOSIS — F29 Unspecified psychosis not due to a substance or known physiological condition: Secondary | ICD-10-CM | POA: Diagnosis not present

## 2014-08-26 DIAGNOSIS — K529 Noninfective gastroenteritis and colitis, unspecified: Secondary | ICD-10-CM | POA: Diagnosis not present

## 2014-08-26 DIAGNOSIS — M6281 Muscle weakness (generalized): Secondary | ICD-10-CM | POA: Diagnosis not present

## 2014-08-26 DIAGNOSIS — F29 Unspecified psychosis not due to a substance or known physiological condition: Secondary | ICD-10-CM | POA: Diagnosis not present

## 2014-08-26 DIAGNOSIS — K580 Irritable bowel syndrome with diarrhea: Secondary | ICD-10-CM | POA: Diagnosis not present

## 2014-08-26 DIAGNOSIS — D66 Hereditary factor VIII deficiency: Secondary | ICD-10-CM | POA: Diagnosis not present

## 2014-08-26 DIAGNOSIS — M00062 Staphylococcal arthritis, left knee: Secondary | ICD-10-CM | POA: Diagnosis not present

## 2014-08-26 DIAGNOSIS — K659 Peritonitis, unspecified: Secondary | ICD-10-CM | POA: Diagnosis not present

## 2014-08-28 DIAGNOSIS — K529 Noninfective gastroenteritis and colitis, unspecified: Secondary | ICD-10-CM | POA: Diagnosis not present

## 2014-08-28 DIAGNOSIS — F29 Unspecified psychosis not due to a substance or known physiological condition: Secondary | ICD-10-CM | POA: Diagnosis not present

## 2014-08-28 DIAGNOSIS — K580 Irritable bowel syndrome with diarrhea: Secondary | ICD-10-CM | POA: Diagnosis not present

## 2014-08-28 DIAGNOSIS — M00062 Staphylococcal arthritis, left knee: Secondary | ICD-10-CM | POA: Diagnosis not present

## 2014-08-28 DIAGNOSIS — D66 Hereditary factor VIII deficiency: Secondary | ICD-10-CM | POA: Diagnosis not present

## 2014-08-28 DIAGNOSIS — K659 Peritonitis, unspecified: Secondary | ICD-10-CM | POA: Diagnosis not present

## 2014-08-28 DIAGNOSIS — M6281 Muscle weakness (generalized): Secondary | ICD-10-CM | POA: Diagnosis not present

## 2014-08-30 ENCOUNTER — Encounter: Payer: Medicare PPO | Admitting: Family Medicine

## 2014-09-01 DIAGNOSIS — D66 Hereditary factor VIII deficiency: Secondary | ICD-10-CM | POA: Diagnosis not present

## 2014-09-01 DIAGNOSIS — D759 Disease of blood and blood-forming organs, unspecified: Secondary | ICD-10-CM | POA: Diagnosis not present

## 2014-09-01 DIAGNOSIS — M363 Arthropathy in other blood disorders: Secondary | ICD-10-CM | POA: Diagnosis not present

## 2014-09-01 DIAGNOSIS — M25522 Pain in left elbow: Secondary | ICD-10-CM | POA: Diagnosis not present

## 2014-09-01 DIAGNOSIS — M19021 Primary osteoarthritis, right elbow: Secondary | ICD-10-CM | POA: Diagnosis not present

## 2014-09-02 DIAGNOSIS — K529 Noninfective gastroenteritis and colitis, unspecified: Secondary | ICD-10-CM | POA: Diagnosis not present

## 2014-09-02 DIAGNOSIS — D66 Hereditary factor VIII deficiency: Secondary | ICD-10-CM | POA: Diagnosis not present

## 2014-09-02 DIAGNOSIS — K659 Peritonitis, unspecified: Secondary | ICD-10-CM | POA: Diagnosis not present

## 2014-09-02 DIAGNOSIS — M00062 Staphylococcal arthritis, left knee: Secondary | ICD-10-CM | POA: Diagnosis not present

## 2014-09-02 DIAGNOSIS — F29 Unspecified psychosis not due to a substance or known physiological condition: Secondary | ICD-10-CM | POA: Diagnosis not present

## 2014-09-02 DIAGNOSIS — M6281 Muscle weakness (generalized): Secondary | ICD-10-CM | POA: Diagnosis not present

## 2014-09-02 DIAGNOSIS — K580 Irritable bowel syndrome with diarrhea: Secondary | ICD-10-CM | POA: Diagnosis not present

## 2014-09-04 DIAGNOSIS — K580 Irritable bowel syndrome with diarrhea: Secondary | ICD-10-CM | POA: Diagnosis not present

## 2014-09-04 DIAGNOSIS — D66 Hereditary factor VIII deficiency: Secondary | ICD-10-CM | POA: Diagnosis not present

## 2014-09-04 DIAGNOSIS — F29 Unspecified psychosis not due to a substance or known physiological condition: Secondary | ICD-10-CM | POA: Diagnosis not present

## 2014-09-04 DIAGNOSIS — K659 Peritonitis, unspecified: Secondary | ICD-10-CM | POA: Diagnosis not present

## 2014-09-04 DIAGNOSIS — M00062 Staphylococcal arthritis, left knee: Secondary | ICD-10-CM | POA: Diagnosis not present

## 2014-09-04 DIAGNOSIS — M6281 Muscle weakness (generalized): Secondary | ICD-10-CM | POA: Diagnosis not present

## 2014-09-04 DIAGNOSIS — K529 Noninfective gastroenteritis and colitis, unspecified: Secondary | ICD-10-CM | POA: Diagnosis not present

## 2014-09-09 DIAGNOSIS — K529 Noninfective gastroenteritis and colitis, unspecified: Secondary | ICD-10-CM | POA: Diagnosis not present

## 2014-09-09 DIAGNOSIS — F29 Unspecified psychosis not due to a substance or known physiological condition: Secondary | ICD-10-CM | POA: Diagnosis not present

## 2014-09-09 DIAGNOSIS — D66 Hereditary factor VIII deficiency: Secondary | ICD-10-CM | POA: Diagnosis not present

## 2014-09-09 DIAGNOSIS — M00062 Staphylococcal arthritis, left knee: Secondary | ICD-10-CM | POA: Diagnosis not present

## 2014-09-09 DIAGNOSIS — K659 Peritonitis, unspecified: Secondary | ICD-10-CM | POA: Diagnosis not present

## 2014-09-09 DIAGNOSIS — K580 Irritable bowel syndrome with diarrhea: Secondary | ICD-10-CM | POA: Diagnosis not present

## 2014-09-09 DIAGNOSIS — M6281 Muscle weakness (generalized): Secondary | ICD-10-CM | POA: Diagnosis not present

## 2014-09-11 DIAGNOSIS — K529 Noninfective gastroenteritis and colitis, unspecified: Secondary | ICD-10-CM | POA: Diagnosis not present

## 2014-09-11 DIAGNOSIS — K659 Peritonitis, unspecified: Secondary | ICD-10-CM | POA: Diagnosis not present

## 2014-09-11 DIAGNOSIS — F29 Unspecified psychosis not due to a substance or known physiological condition: Secondary | ICD-10-CM | POA: Diagnosis not present

## 2014-09-11 DIAGNOSIS — D66 Hereditary factor VIII deficiency: Secondary | ICD-10-CM | POA: Diagnosis not present

## 2014-09-16 DIAGNOSIS — F29 Unspecified psychosis not due to a substance or known physiological condition: Secondary | ICD-10-CM | POA: Diagnosis not present

## 2014-09-16 DIAGNOSIS — K529 Noninfective gastroenteritis and colitis, unspecified: Secondary | ICD-10-CM | POA: Diagnosis not present

## 2014-09-16 DIAGNOSIS — K659 Peritonitis, unspecified: Secondary | ICD-10-CM | POA: Diagnosis not present

## 2014-09-16 DIAGNOSIS — D66 Hereditary factor VIII deficiency: Secondary | ICD-10-CM | POA: Diagnosis not present

## 2014-09-18 DIAGNOSIS — F29 Unspecified psychosis not due to a substance or known physiological condition: Secondary | ICD-10-CM | POA: Diagnosis not present

## 2014-09-18 DIAGNOSIS — K659 Peritonitis, unspecified: Secondary | ICD-10-CM | POA: Diagnosis not present

## 2014-09-18 DIAGNOSIS — K529 Noninfective gastroenteritis and colitis, unspecified: Secondary | ICD-10-CM | POA: Diagnosis not present

## 2014-09-18 DIAGNOSIS — D66 Hereditary factor VIII deficiency: Secondary | ICD-10-CM | POA: Diagnosis not present

## 2014-09-23 DIAGNOSIS — D66 Hereditary factor VIII deficiency: Secondary | ICD-10-CM | POA: Diagnosis not present

## 2014-09-23 DIAGNOSIS — F29 Unspecified psychosis not due to a substance or known physiological condition: Secondary | ICD-10-CM | POA: Diagnosis not present

## 2014-09-23 DIAGNOSIS — K659 Peritonitis, unspecified: Secondary | ICD-10-CM | POA: Diagnosis not present

## 2014-09-23 DIAGNOSIS — K529 Noninfective gastroenteritis and colitis, unspecified: Secondary | ICD-10-CM | POA: Diagnosis not present

## 2014-09-25 DIAGNOSIS — F29 Unspecified psychosis not due to a substance or known physiological condition: Secondary | ICD-10-CM | POA: Diagnosis not present

## 2014-09-25 DIAGNOSIS — K659 Peritonitis, unspecified: Secondary | ICD-10-CM | POA: Diagnosis not present

## 2014-09-25 DIAGNOSIS — K529 Noninfective gastroenteritis and colitis, unspecified: Secondary | ICD-10-CM | POA: Diagnosis not present

## 2014-09-25 DIAGNOSIS — D66 Hereditary factor VIII deficiency: Secondary | ICD-10-CM | POA: Diagnosis not present

## 2014-09-30 DIAGNOSIS — K529 Noninfective gastroenteritis and colitis, unspecified: Secondary | ICD-10-CM | POA: Diagnosis not present

## 2014-09-30 DIAGNOSIS — K659 Peritonitis, unspecified: Secondary | ICD-10-CM | POA: Diagnosis not present

## 2014-09-30 DIAGNOSIS — D66 Hereditary factor VIII deficiency: Secondary | ICD-10-CM | POA: Diagnosis not present

## 2014-09-30 DIAGNOSIS — F29 Unspecified psychosis not due to a substance or known physiological condition: Secondary | ICD-10-CM | POA: Diagnosis not present

## 2014-10-02 DIAGNOSIS — D66 Hereditary factor VIII deficiency: Secondary | ICD-10-CM | POA: Diagnosis not present

## 2014-10-02 DIAGNOSIS — K659 Peritonitis, unspecified: Secondary | ICD-10-CM | POA: Diagnosis not present

## 2014-10-02 DIAGNOSIS — K529 Noninfective gastroenteritis and colitis, unspecified: Secondary | ICD-10-CM | POA: Diagnosis not present

## 2014-10-02 DIAGNOSIS — F29 Unspecified psychosis not due to a substance or known physiological condition: Secondary | ICD-10-CM | POA: Diagnosis not present

## 2014-10-07 DIAGNOSIS — F29 Unspecified psychosis not due to a substance or known physiological condition: Secondary | ICD-10-CM | POA: Diagnosis not present

## 2014-10-07 DIAGNOSIS — K659 Peritonitis, unspecified: Secondary | ICD-10-CM | POA: Diagnosis not present

## 2014-10-07 DIAGNOSIS — D66 Hereditary factor VIII deficiency: Secondary | ICD-10-CM | POA: Diagnosis not present

## 2014-10-07 DIAGNOSIS — K529 Noninfective gastroenteritis and colitis, unspecified: Secondary | ICD-10-CM | POA: Diagnosis not present

## 2014-10-30 DIAGNOSIS — D66 Hereditary factor VIII deficiency: Secondary | ICD-10-CM | POA: Diagnosis not present

## 2014-10-30 DIAGNOSIS — K529 Noninfective gastroenteritis and colitis, unspecified: Secondary | ICD-10-CM | POA: Diagnosis not present

## 2014-10-30 DIAGNOSIS — K659 Peritonitis, unspecified: Secondary | ICD-10-CM | POA: Diagnosis not present

## 2014-10-30 DIAGNOSIS — F29 Unspecified psychosis not due to a substance or known physiological condition: Secondary | ICD-10-CM | POA: Diagnosis not present

## 2014-11-01 DIAGNOSIS — K7460 Unspecified cirrhosis of liver: Secondary | ICD-10-CM | POA: Diagnosis not present

## 2014-11-01 DIAGNOSIS — B182 Chronic viral hepatitis C: Secondary | ICD-10-CM | POA: Diagnosis not present

## 2014-11-04 DIAGNOSIS — F29 Unspecified psychosis not due to a substance or known physiological condition: Secondary | ICD-10-CM | POA: Diagnosis not present

## 2014-11-04 DIAGNOSIS — K529 Noninfective gastroenteritis and colitis, unspecified: Secondary | ICD-10-CM | POA: Diagnosis not present

## 2014-11-04 DIAGNOSIS — D66 Hereditary factor VIII deficiency: Secondary | ICD-10-CM | POA: Diagnosis not present

## 2014-11-04 DIAGNOSIS — K659 Peritonitis, unspecified: Secondary | ICD-10-CM | POA: Diagnosis not present

## 2014-11-06 DIAGNOSIS — D66 Hereditary factor VIII deficiency: Secondary | ICD-10-CM | POA: Diagnosis not present

## 2014-11-06 DIAGNOSIS — F29 Unspecified psychosis not due to a substance or known physiological condition: Secondary | ICD-10-CM | POA: Diagnosis not present

## 2014-11-06 DIAGNOSIS — K659 Peritonitis, unspecified: Secondary | ICD-10-CM | POA: Diagnosis not present

## 2014-11-06 DIAGNOSIS — K529 Noninfective gastroenteritis and colitis, unspecified: Secondary | ICD-10-CM | POA: Diagnosis not present

## 2014-11-08 DIAGNOSIS — K7460 Unspecified cirrhosis of liver: Secondary | ICD-10-CM | POA: Diagnosis not present

## 2014-11-08 DIAGNOSIS — N200 Calculus of kidney: Secondary | ICD-10-CM | POA: Diagnosis not present

## 2014-11-08 DIAGNOSIS — K8020 Calculus of gallbladder without cholecystitis without obstruction: Secondary | ICD-10-CM | POA: Diagnosis not present

## 2014-11-08 DIAGNOSIS — N281 Cyst of kidney, acquired: Secondary | ICD-10-CM | POA: Diagnosis not present

## 2014-11-09 ENCOUNTER — Other Ambulatory Visit: Payer: Self-pay | Admitting: Pharmacist

## 2014-11-09 DIAGNOSIS — D66 Hereditary factor VIII deficiency: Secondary | ICD-10-CM | POA: Diagnosis not present

## 2014-11-09 DIAGNOSIS — F29 Unspecified psychosis not due to a substance or known physiological condition: Secondary | ICD-10-CM | POA: Diagnosis not present

## 2014-11-09 DIAGNOSIS — K529 Noninfective gastroenteritis and colitis, unspecified: Secondary | ICD-10-CM | POA: Diagnosis not present

## 2014-11-09 DIAGNOSIS — K659 Peritonitis, unspecified: Secondary | ICD-10-CM | POA: Diagnosis not present

## 2014-11-09 NOTE — Patient Outreach (Signed)
[facility]) Care Management  [facility]   11/09/2014  Zakhari Fogel October 13, 1955 [id number]  Subjective: Mr. Orozco is a 59 year old male who was referred to [facility] CM Pharmacist from [facility] for medication review due to high-cost pharmacy per internal [employer] report.  Chart was reviewed.  Patient's medication fill list was obtained from [facility].    Objective:   Current Medications: Current Outpatient Prescriptions  Medication Sig Dispense Refill  . Casanthranol-Docusate Sodium 30-100 MG CAPS Take by mouth.    . citalopram (CELEXA) 40 MG tablet Take 1 tablet (40 mg total) by mouth daily. 90 tablet 3  . morphine (KADIAN) 30 MG 24 hr capsule Take 30 mg by mouth daily.    Marland Kitchen morphine (MSIR) 15 MG tablet Take 15 mg by mouth every 4 (four) hours as needed.     No current facility-administered medications for this visit.   Allergies  Allergen Reactions  . Chantix [Varenicline Tartrate] Other (See Comments)    Changes mood  . Wellbutrin [Bupropion Hcl] Palpitations  . Asa Arthritis Strength-Antacid [Aspirin Buffered] Other (See Comments)    Due to blood thinner  . Other     cryopercipate   Patient Active Problem List   Diagnosis Date Noted  . Cataract 04/28/2012  . DJD (degenerative joint disease) 04/28/2012  . Abnormality of gait 04/28/2012  . Hemophilia 05/28/2011  . Hepatitis C 05/28/2011  . Chronic pain 05/28/2011  . Depression 05/28/2011   Assessment:  Drugs sorted by system:  Neurologic/Psychologic: Citalopram 40mg  daily   Cardiovascular: none reported  Pulmonary/Allergy: none reported  Gastrointestinal: Pantoprazole 40mg  daily  Endocrine: none reported  Renal: none reported  Topical: none reported  Pain: Morphine sulfate 30mg  ER, morphine sulfate 15mg  IR   Miscellaneous: - Antihemophil FVIII (Helixate FS) 3,000 units Powder for injection (quantity [id number]) - Ledipasvir-sofosbuvir (Harvoni) 90-400mg  (last filled 02/13/14) -  Prednisone 10mg  tablet (one time fill)  Duplications in therapy: none  Gaps in therapy: none noted Medications to avoid in the elderly: N/A patient is <14 year old.  When patient reaches age 60 yo, the maximum recommended dose of citalopram is 20mg  daily due to increased exposure and the risk of QT prolongation.  Drug interactions: Concurrent use of citalopram and pantoprazole may result in increased citalopram exposure and risk of QT interval prolongation.    1. Medication cost: Per review of medication list, Helixate FS for hemophilia and Harvoni for hepatitis C are likely the biggest contributors to overall pharmacy cost.  Average wholesale price is $1.76 per unit for Helixate FS and $37800 for a one month supply of Harvoni.  Per PCP note on 04/05/14, patient has finished course of Harvoni.  This is confirmed by patient's [employer] fill list since patient has not filled Harvoni since November 2015.  Therefore, recent pharmacy cost likely due to [facility] FS for hemophilia.    Plan: 1. I reached out to [facility] regarding preferred recombinant factor VIII product.  Per [employer], there is not currently a preferred recombinant factor VIII product and Helixate appears to fall on the lower end of the cost spectrum for recombinant factor VIII products.  2. Will alert [facility] regarding findings of the medication reconciliation.  3. For drug interaction between citalopram and pantoprazole, would recommend closely monitoring QT interval for prolongation.  Patient is currently on citalopram 40mg  daily.  Doses greater than 20mg  are not recommended for patients > 59 years old.  Patient is currently 59 yo.  Patient will need a  citalopram dose reduction at age 69yo or could consider citalopram dose reduction at this time due to increased risk of QT prolongation due to interaction with pantoprazole.  I will send PCP a letter updating on my findings.     Lilla Shook, Pharm.D. Pharmacy  Resident [facility]

## 2014-11-10 ENCOUNTER — Encounter: Payer: Self-pay | Admitting: Pharmacist

## 2014-11-13 DIAGNOSIS — D66 Hereditary factor VIII deficiency: Secondary | ICD-10-CM | POA: Diagnosis not present

## 2014-11-13 DIAGNOSIS — K659 Peritonitis, unspecified: Secondary | ICD-10-CM | POA: Diagnosis not present

## 2014-11-13 DIAGNOSIS — K529 Noninfective gastroenteritis and colitis, unspecified: Secondary | ICD-10-CM | POA: Diagnosis not present

## 2014-11-13 DIAGNOSIS — F29 Unspecified psychosis not due to a substance or known physiological condition: Secondary | ICD-10-CM | POA: Diagnosis not present

## 2014-11-18 DIAGNOSIS — K659 Peritonitis, unspecified: Secondary | ICD-10-CM | POA: Diagnosis not present

## 2014-11-18 DIAGNOSIS — K529 Noninfective gastroenteritis and colitis, unspecified: Secondary | ICD-10-CM | POA: Diagnosis not present

## 2014-11-18 DIAGNOSIS — D66 Hereditary factor VIII deficiency: Secondary | ICD-10-CM | POA: Diagnosis not present

## 2014-11-18 DIAGNOSIS — F29 Unspecified psychosis not due to a substance or known physiological condition: Secondary | ICD-10-CM | POA: Diagnosis not present

## 2014-11-20 DIAGNOSIS — K529 Noninfective gastroenteritis and colitis, unspecified: Secondary | ICD-10-CM | POA: Diagnosis not present

## 2014-11-20 DIAGNOSIS — D66 Hereditary factor VIII deficiency: Secondary | ICD-10-CM | POA: Diagnosis not present

## 2014-11-20 DIAGNOSIS — K659 Peritonitis, unspecified: Secondary | ICD-10-CM | POA: Diagnosis not present

## 2014-11-20 DIAGNOSIS — F29 Unspecified psychosis not due to a substance or known physiological condition: Secondary | ICD-10-CM | POA: Diagnosis not present

## 2014-11-25 DIAGNOSIS — K529 Noninfective gastroenteritis and colitis, unspecified: Secondary | ICD-10-CM | POA: Diagnosis not present

## 2014-11-25 DIAGNOSIS — K659 Peritonitis, unspecified: Secondary | ICD-10-CM | POA: Diagnosis not present

## 2014-11-25 DIAGNOSIS — D66 Hereditary factor VIII deficiency: Secondary | ICD-10-CM | POA: Diagnosis not present

## 2014-11-25 DIAGNOSIS — F29 Unspecified psychosis not due to a substance or known physiological condition: Secondary | ICD-10-CM | POA: Diagnosis not present

## 2014-11-27 DIAGNOSIS — F29 Unspecified psychosis not due to a substance or known physiological condition: Secondary | ICD-10-CM | POA: Diagnosis not present

## 2014-11-27 DIAGNOSIS — D66 Hereditary factor VIII deficiency: Secondary | ICD-10-CM | POA: Diagnosis not present

## 2014-11-27 DIAGNOSIS — K529 Noninfective gastroenteritis and colitis, unspecified: Secondary | ICD-10-CM | POA: Diagnosis not present

## 2014-11-27 DIAGNOSIS — K659 Peritonitis, unspecified: Secondary | ICD-10-CM | POA: Diagnosis not present

## 2014-12-02 DIAGNOSIS — F29 Unspecified psychosis not due to a substance or known physiological condition: Secondary | ICD-10-CM | POA: Diagnosis not present

## 2014-12-02 DIAGNOSIS — D66 Hereditary factor VIII deficiency: Secondary | ICD-10-CM | POA: Diagnosis not present

## 2014-12-02 DIAGNOSIS — K529 Noninfective gastroenteritis and colitis, unspecified: Secondary | ICD-10-CM | POA: Diagnosis not present

## 2014-12-02 DIAGNOSIS — K659 Peritonitis, unspecified: Secondary | ICD-10-CM | POA: Diagnosis not present

## 2014-12-04 DIAGNOSIS — K659 Peritonitis, unspecified: Secondary | ICD-10-CM | POA: Diagnosis not present

## 2014-12-04 DIAGNOSIS — K529 Noninfective gastroenteritis and colitis, unspecified: Secondary | ICD-10-CM | POA: Diagnosis not present

## 2014-12-04 DIAGNOSIS — F29 Unspecified psychosis not due to a substance or known physiological condition: Secondary | ICD-10-CM | POA: Diagnosis not present

## 2014-12-04 DIAGNOSIS — D66 Hereditary factor VIII deficiency: Secondary | ICD-10-CM | POA: Diagnosis not present

## 2014-12-09 DIAGNOSIS — D66 Hereditary factor VIII deficiency: Secondary | ICD-10-CM | POA: Diagnosis not present

## 2014-12-09 DIAGNOSIS — F29 Unspecified psychosis not due to a substance or known physiological condition: Secondary | ICD-10-CM | POA: Diagnosis not present

## 2014-12-09 DIAGNOSIS — K529 Noninfective gastroenteritis and colitis, unspecified: Secondary | ICD-10-CM | POA: Diagnosis not present

## 2014-12-09 DIAGNOSIS — K659 Peritonitis, unspecified: Secondary | ICD-10-CM | POA: Diagnosis not present

## 2014-12-11 DIAGNOSIS — K529 Noninfective gastroenteritis and colitis, unspecified: Secondary | ICD-10-CM | POA: Diagnosis not present

## 2014-12-11 DIAGNOSIS — D66 Hereditary factor VIII deficiency: Secondary | ICD-10-CM | POA: Diagnosis not present

## 2014-12-11 DIAGNOSIS — K659 Peritonitis, unspecified: Secondary | ICD-10-CM | POA: Diagnosis not present

## 2014-12-11 DIAGNOSIS — F29 Unspecified psychosis not due to a substance or known physiological condition: Secondary | ICD-10-CM | POA: Diagnosis not present

## 2014-12-16 DIAGNOSIS — F29 Unspecified psychosis not due to a substance or known physiological condition: Secondary | ICD-10-CM | POA: Diagnosis not present

## 2014-12-16 DIAGNOSIS — K529 Noninfective gastroenteritis and colitis, unspecified: Secondary | ICD-10-CM | POA: Diagnosis not present

## 2014-12-16 DIAGNOSIS — K659 Peritonitis, unspecified: Secondary | ICD-10-CM | POA: Diagnosis not present

## 2014-12-16 DIAGNOSIS — D66 Hereditary factor VIII deficiency: Secondary | ICD-10-CM | POA: Diagnosis not present

## 2014-12-18 DIAGNOSIS — K659 Peritonitis, unspecified: Secondary | ICD-10-CM | POA: Diagnosis not present

## 2014-12-18 DIAGNOSIS — D66 Hereditary factor VIII deficiency: Secondary | ICD-10-CM | POA: Diagnosis not present

## 2014-12-18 DIAGNOSIS — F29 Unspecified psychosis not due to a substance or known physiological condition: Secondary | ICD-10-CM | POA: Diagnosis not present

## 2014-12-18 DIAGNOSIS — K529 Noninfective gastroenteritis and colitis, unspecified: Secondary | ICD-10-CM | POA: Diagnosis not present

## 2014-12-23 DIAGNOSIS — F29 Unspecified psychosis not due to a substance or known physiological condition: Secondary | ICD-10-CM | POA: Diagnosis not present

## 2014-12-23 DIAGNOSIS — K659 Peritonitis, unspecified: Secondary | ICD-10-CM | POA: Diagnosis not present

## 2014-12-23 DIAGNOSIS — K529 Noninfective gastroenteritis and colitis, unspecified: Secondary | ICD-10-CM | POA: Diagnosis not present

## 2014-12-23 DIAGNOSIS — D66 Hereditary factor VIII deficiency: Secondary | ICD-10-CM | POA: Diagnosis not present

## 2014-12-25 DIAGNOSIS — K529 Noninfective gastroenteritis and colitis, unspecified: Secondary | ICD-10-CM | POA: Diagnosis not present

## 2014-12-25 DIAGNOSIS — D66 Hereditary factor VIII deficiency: Secondary | ICD-10-CM | POA: Diagnosis not present

## 2014-12-25 DIAGNOSIS — K659 Peritonitis, unspecified: Secondary | ICD-10-CM | POA: Diagnosis not present

## 2014-12-25 DIAGNOSIS — F29 Unspecified psychosis not due to a substance or known physiological condition: Secondary | ICD-10-CM | POA: Diagnosis not present

## 2014-12-30 DIAGNOSIS — K659 Peritonitis, unspecified: Secondary | ICD-10-CM | POA: Diagnosis not present

## 2014-12-30 DIAGNOSIS — D66 Hereditary factor VIII deficiency: Secondary | ICD-10-CM | POA: Diagnosis not present

## 2014-12-30 DIAGNOSIS — K529 Noninfective gastroenteritis and colitis, unspecified: Secondary | ICD-10-CM | POA: Diagnosis not present

## 2014-12-30 DIAGNOSIS — F29 Unspecified psychosis not due to a substance or known physiological condition: Secondary | ICD-10-CM | POA: Diagnosis not present

## 2015-01-01 DIAGNOSIS — D66 Hereditary factor VIII deficiency: Secondary | ICD-10-CM | POA: Diagnosis not present

## 2015-01-01 DIAGNOSIS — F29 Unspecified psychosis not due to a substance or known physiological condition: Secondary | ICD-10-CM | POA: Diagnosis not present

## 2015-01-01 DIAGNOSIS — K659 Peritonitis, unspecified: Secondary | ICD-10-CM | POA: Diagnosis not present

## 2015-01-01 DIAGNOSIS — K529 Noninfective gastroenteritis and colitis, unspecified: Secondary | ICD-10-CM | POA: Diagnosis not present

## 2015-01-07 DIAGNOSIS — F29 Unspecified psychosis not due to a substance or known physiological condition: Secondary | ICD-10-CM | POA: Diagnosis not present

## 2015-01-07 DIAGNOSIS — K659 Peritonitis, unspecified: Secondary | ICD-10-CM | POA: Diagnosis not present

## 2015-01-07 DIAGNOSIS — K529 Noninfective gastroenteritis and colitis, unspecified: Secondary | ICD-10-CM | POA: Diagnosis not present

## 2015-01-07 DIAGNOSIS — D66 Hereditary factor VIII deficiency: Secondary | ICD-10-CM | POA: Diagnosis not present

## 2015-01-13 DIAGNOSIS — K529 Noninfective gastroenteritis and colitis, unspecified: Secondary | ICD-10-CM | POA: Diagnosis not present

## 2015-01-13 DIAGNOSIS — K659 Peritonitis, unspecified: Secondary | ICD-10-CM | POA: Diagnosis not present

## 2015-01-13 DIAGNOSIS — F29 Unspecified psychosis not due to a substance or known physiological condition: Secondary | ICD-10-CM | POA: Diagnosis not present

## 2015-01-13 DIAGNOSIS — Z4800 Encounter for change or removal of nonsurgical wound dressing: Secondary | ICD-10-CM | POA: Diagnosis not present

## 2015-01-13 DIAGNOSIS — D66 Hereditary factor VIII deficiency: Secondary | ICD-10-CM | POA: Diagnosis not present

## 2015-01-21 DIAGNOSIS — F29 Unspecified psychosis not due to a substance or known physiological condition: Secondary | ICD-10-CM | POA: Diagnosis not present

## 2015-01-21 DIAGNOSIS — Z4800 Encounter for change or removal of nonsurgical wound dressing: Secondary | ICD-10-CM | POA: Diagnosis not present

## 2015-01-21 DIAGNOSIS — D66 Hereditary factor VIII deficiency: Secondary | ICD-10-CM | POA: Diagnosis not present

## 2015-01-21 DIAGNOSIS — K529 Noninfective gastroenteritis and colitis, unspecified: Secondary | ICD-10-CM | POA: Diagnosis not present

## 2015-01-21 DIAGNOSIS — K659 Peritonitis, unspecified: Secondary | ICD-10-CM | POA: Diagnosis not present

## 2015-01-27 DIAGNOSIS — K659 Peritonitis, unspecified: Secondary | ICD-10-CM | POA: Diagnosis not present

## 2015-01-27 DIAGNOSIS — K529 Noninfective gastroenteritis and colitis, unspecified: Secondary | ICD-10-CM | POA: Diagnosis not present

## 2015-01-27 DIAGNOSIS — D66 Hereditary factor VIII deficiency: Secondary | ICD-10-CM | POA: Diagnosis not present

## 2015-01-27 DIAGNOSIS — F29 Unspecified psychosis not due to a substance or known physiological condition: Secondary | ICD-10-CM | POA: Diagnosis not present

## 2015-01-27 DIAGNOSIS — Z4800 Encounter for change or removal of nonsurgical wound dressing: Secondary | ICD-10-CM | POA: Diagnosis not present

## 2015-02-03 DIAGNOSIS — D66 Hereditary factor VIII deficiency: Secondary | ICD-10-CM | POA: Diagnosis not present

## 2015-02-03 DIAGNOSIS — K659 Peritonitis, unspecified: Secondary | ICD-10-CM | POA: Diagnosis not present

## 2015-02-03 DIAGNOSIS — Z4800 Encounter for change or removal of nonsurgical wound dressing: Secondary | ICD-10-CM | POA: Diagnosis not present

## 2015-02-03 DIAGNOSIS — F29 Unspecified psychosis not due to a substance or known physiological condition: Secondary | ICD-10-CM | POA: Diagnosis not present

## 2015-02-03 DIAGNOSIS — K529 Noninfective gastroenteritis and colitis, unspecified: Secondary | ICD-10-CM | POA: Diagnosis not present

## 2015-02-09 DIAGNOSIS — F29 Unspecified psychosis not due to a substance or known physiological condition: Secondary | ICD-10-CM | POA: Diagnosis not present

## 2015-02-09 DIAGNOSIS — K529 Noninfective gastroenteritis and colitis, unspecified: Secondary | ICD-10-CM | POA: Diagnosis not present

## 2015-02-09 DIAGNOSIS — Z4800 Encounter for change or removal of nonsurgical wound dressing: Secondary | ICD-10-CM | POA: Diagnosis not present

## 2015-02-09 DIAGNOSIS — K659 Peritonitis, unspecified: Secondary | ICD-10-CM | POA: Diagnosis not present

## 2015-02-09 DIAGNOSIS — D66 Hereditary factor VIII deficiency: Secondary | ICD-10-CM | POA: Diagnosis not present

## 2015-02-16 DIAGNOSIS — M362 Hemophilic arthropathy: Secondary | ICD-10-CM | POA: Diagnosis not present

## 2015-02-16 DIAGNOSIS — G8929 Other chronic pain: Secondary | ICD-10-CM | POA: Diagnosis not present

## 2015-02-16 DIAGNOSIS — R630 Anorexia: Secondary | ICD-10-CM | POA: Diagnosis not present

## 2015-02-16 DIAGNOSIS — D66 Hereditary factor VIII deficiency: Secondary | ICD-10-CM | POA: Diagnosis not present

## 2015-02-17 DIAGNOSIS — K529 Noninfective gastroenteritis and colitis, unspecified: Secondary | ICD-10-CM | POA: Diagnosis not present

## 2015-02-17 DIAGNOSIS — Z4800 Encounter for change or removal of nonsurgical wound dressing: Secondary | ICD-10-CM | POA: Diagnosis not present

## 2015-02-17 DIAGNOSIS — D66 Hereditary factor VIII deficiency: Secondary | ICD-10-CM | POA: Diagnosis not present

## 2015-02-17 DIAGNOSIS — K659 Peritonitis, unspecified: Secondary | ICD-10-CM | POA: Diagnosis not present

## 2015-02-17 DIAGNOSIS — F29 Unspecified psychosis not due to a substance or known physiological condition: Secondary | ICD-10-CM | POA: Diagnosis not present

## 2015-02-24 DIAGNOSIS — F29 Unspecified psychosis not due to a substance or known physiological condition: Secondary | ICD-10-CM | POA: Diagnosis not present

## 2015-02-24 DIAGNOSIS — Z4800 Encounter for change or removal of nonsurgical wound dressing: Secondary | ICD-10-CM | POA: Diagnosis not present

## 2015-02-24 DIAGNOSIS — K659 Peritonitis, unspecified: Secondary | ICD-10-CM | POA: Diagnosis not present

## 2015-02-24 DIAGNOSIS — D66 Hereditary factor VIII deficiency: Secondary | ICD-10-CM | POA: Diagnosis not present

## 2015-02-24 DIAGNOSIS — K529 Noninfective gastroenteritis and colitis, unspecified: Secondary | ICD-10-CM | POA: Diagnosis not present

## 2015-03-03 DIAGNOSIS — D66 Hereditary factor VIII deficiency: Secondary | ICD-10-CM | POA: Diagnosis not present

## 2015-03-03 DIAGNOSIS — K529 Noninfective gastroenteritis and colitis, unspecified: Secondary | ICD-10-CM | POA: Diagnosis not present

## 2015-03-03 DIAGNOSIS — Z4800 Encounter for change or removal of nonsurgical wound dressing: Secondary | ICD-10-CM | POA: Diagnosis not present

## 2015-03-03 DIAGNOSIS — F29 Unspecified psychosis not due to a substance or known physiological condition: Secondary | ICD-10-CM | POA: Diagnosis not present

## 2015-03-03 DIAGNOSIS — K659 Peritonitis, unspecified: Secondary | ICD-10-CM | POA: Diagnosis not present

## 2015-03-09 DIAGNOSIS — K529 Noninfective gastroenteritis and colitis, unspecified: Secondary | ICD-10-CM | POA: Diagnosis not present

## 2015-03-09 DIAGNOSIS — D66 Hereditary factor VIII deficiency: Secondary | ICD-10-CM | POA: Diagnosis not present

## 2015-03-09 DIAGNOSIS — Z4800 Encounter for change or removal of nonsurgical wound dressing: Secondary | ICD-10-CM | POA: Diagnosis not present

## 2015-03-09 DIAGNOSIS — F29 Unspecified psychosis not due to a substance or known physiological condition: Secondary | ICD-10-CM | POA: Diagnosis not present

## 2015-03-09 DIAGNOSIS — K659 Peritonitis, unspecified: Secondary | ICD-10-CM | POA: Diagnosis not present

## 2015-03-21 ENCOUNTER — Encounter: Payer: Commercial Managed Care - HMO | Admitting: Family Medicine

## 2015-03-23 ENCOUNTER — Encounter: Payer: Commercial Managed Care - HMO | Admitting: Family Medicine

## 2015-03-31 DIAGNOSIS — D66 Hereditary factor VIII deficiency: Secondary | ICD-10-CM | POA: Diagnosis not present

## 2015-03-31 DIAGNOSIS — F29 Unspecified psychosis not due to a substance or known physiological condition: Secondary | ICD-10-CM | POA: Diagnosis not present

## 2015-03-31 DIAGNOSIS — K529 Noninfective gastroenteritis and colitis, unspecified: Secondary | ICD-10-CM | POA: Diagnosis not present

## 2015-03-31 DIAGNOSIS — K659 Peritonitis, unspecified: Secondary | ICD-10-CM | POA: Diagnosis not present

## 2015-03-31 DIAGNOSIS — Z4800 Encounter for change or removal of nonsurgical wound dressing: Secondary | ICD-10-CM | POA: Diagnosis not present

## 2015-04-07 DIAGNOSIS — D66 Hereditary factor VIII deficiency: Secondary | ICD-10-CM | POA: Diagnosis not present

## 2015-04-07 DIAGNOSIS — F29 Unspecified psychosis not due to a substance or known physiological condition: Secondary | ICD-10-CM | POA: Diagnosis not present

## 2015-04-07 DIAGNOSIS — K529 Noninfective gastroenteritis and colitis, unspecified: Secondary | ICD-10-CM | POA: Diagnosis not present

## 2015-04-07 DIAGNOSIS — K659 Peritonitis, unspecified: Secondary | ICD-10-CM | POA: Diagnosis not present

## 2015-04-07 DIAGNOSIS — Z4800 Encounter for change or removal of nonsurgical wound dressing: Secondary | ICD-10-CM | POA: Diagnosis not present

## 2015-04-08 ENCOUNTER — Encounter: Payer: Self-pay | Admitting: Family Medicine

## 2015-04-13 ENCOUNTER — Encounter: Payer: Self-pay | Admitting: Family Medicine

## 2015-04-14 ENCOUNTER — Telehealth: Payer: Self-pay

## 2015-04-14 DIAGNOSIS — F29 Unspecified psychosis not due to a substance or known physiological condition: Secondary | ICD-10-CM | POA: Diagnosis not present

## 2015-04-14 DIAGNOSIS — Z4800 Encounter for change or removal of nonsurgical wound dressing: Secondary | ICD-10-CM | POA: Diagnosis not present

## 2015-04-14 DIAGNOSIS — K529 Noninfective gastroenteritis and colitis, unspecified: Secondary | ICD-10-CM | POA: Diagnosis not present

## 2015-04-14 DIAGNOSIS — K659 Peritonitis, unspecified: Secondary | ICD-10-CM | POA: Diagnosis not present

## 2015-04-14 DIAGNOSIS — D66 Hereditary factor VIII deficiency: Secondary | ICD-10-CM | POA: Diagnosis not present

## 2015-04-14 NOTE — Telephone Encounter (Signed)
Patient would like Dr Patsy Lager to give him a call in regard to his medicine stated he would try to call back in the morning to try and get her.  His call back number is [phone]

## 2015-04-15 NOTE — Telephone Encounter (Signed)
Called and discussed with his wife.  They had actually just called about my move to a new office.  I gave them contact info and they will schedule to see me in [place]

## 2015-04-21 DIAGNOSIS — Z4800 Encounter for change or removal of nonsurgical wound dressing: Secondary | ICD-10-CM | POA: Diagnosis not present

## 2015-04-21 DIAGNOSIS — F29 Unspecified psychosis not due to a substance or known physiological condition: Secondary | ICD-10-CM | POA: Diagnosis not present

## 2015-04-21 DIAGNOSIS — K529 Noninfective gastroenteritis and colitis, unspecified: Secondary | ICD-10-CM | POA: Diagnosis not present

## 2015-04-21 DIAGNOSIS — K659 Peritonitis, unspecified: Secondary | ICD-10-CM | POA: Diagnosis not present

## 2015-04-21 DIAGNOSIS — D66 Hereditary factor VIII deficiency: Secondary | ICD-10-CM | POA: Diagnosis not present

## 2015-04-28 DIAGNOSIS — K659 Peritonitis, unspecified: Secondary | ICD-10-CM | POA: Diagnosis not present

## 2015-04-28 DIAGNOSIS — K529 Noninfective gastroenteritis and colitis, unspecified: Secondary | ICD-10-CM | POA: Diagnosis not present

## 2015-04-28 DIAGNOSIS — Z4800 Encounter for change or removal of nonsurgical wound dressing: Secondary | ICD-10-CM | POA: Diagnosis not present

## 2015-04-28 DIAGNOSIS — F29 Unspecified psychosis not due to a substance or known physiological condition: Secondary | ICD-10-CM | POA: Diagnosis not present

## 2015-04-28 DIAGNOSIS — D66 Hereditary factor VIII deficiency: Secondary | ICD-10-CM | POA: Diagnosis not present

## 2015-05-04 DIAGNOSIS — F5089 Other specified eating disorder: Secondary | ICD-10-CM | POA: Diagnosis not present

## 2015-05-04 DIAGNOSIS — M8929 Other disorders of bone development and growth, multiple sites: Secondary | ICD-10-CM | POA: Diagnosis not present

## 2015-05-04 DIAGNOSIS — M362 Hemophilic arthropathy: Secondary | ICD-10-CM | POA: Diagnosis not present

## 2015-05-04 DIAGNOSIS — Z959 Presence of cardiac and vascular implant and graft, unspecified: Secondary | ICD-10-CM | POA: Diagnosis not present

## 2015-05-04 DIAGNOSIS — D66 Hereditary factor VIII deficiency: Secondary | ICD-10-CM | POA: Diagnosis not present

## 2015-05-05 DIAGNOSIS — Z4800 Encounter for change or removal of nonsurgical wound dressing: Secondary | ICD-10-CM | POA: Diagnosis not present

## 2015-05-05 DIAGNOSIS — D66 Hereditary factor VIII deficiency: Secondary | ICD-10-CM | POA: Diagnosis not present

## 2015-05-05 DIAGNOSIS — K659 Peritonitis, unspecified: Secondary | ICD-10-CM | POA: Diagnosis not present

## 2015-05-05 DIAGNOSIS — F29 Unspecified psychosis not due to a substance or known physiological condition: Secondary | ICD-10-CM | POA: Diagnosis not present

## 2015-05-05 DIAGNOSIS — K529 Noninfective gastroenteritis and colitis, unspecified: Secondary | ICD-10-CM | POA: Diagnosis not present

## 2015-05-09 DIAGNOSIS — D66 Hereditary factor VIII deficiency: Secondary | ICD-10-CM | POA: Diagnosis not present

## 2015-05-12 DIAGNOSIS — D66 Hereditary factor VIII deficiency: Secondary | ICD-10-CM | POA: Diagnosis not present

## 2015-05-12 DIAGNOSIS — F29 Unspecified psychosis not due to a substance or known physiological condition: Secondary | ICD-10-CM | POA: Diagnosis not present

## 2015-05-12 DIAGNOSIS — K529 Noninfective gastroenteritis and colitis, unspecified: Secondary | ICD-10-CM | POA: Diagnosis not present

## 2015-05-12 DIAGNOSIS — K659 Peritonitis, unspecified: Secondary | ICD-10-CM | POA: Diagnosis not present

## 2015-05-12 DIAGNOSIS — Z4800 Encounter for change or removal of nonsurgical wound dressing: Secondary | ICD-10-CM | POA: Diagnosis not present

## 2015-05-13 DIAGNOSIS — D66 Hereditary factor VIII deficiency: Secondary | ICD-10-CM | POA: Diagnosis not present

## 2015-05-19 DIAGNOSIS — D66 Hereditary factor VIII deficiency: Secondary | ICD-10-CM | POA: Diagnosis not present

## 2015-05-19 DIAGNOSIS — F29 Unspecified psychosis not due to a substance or known physiological condition: Secondary | ICD-10-CM | POA: Diagnosis not present

## 2015-05-19 DIAGNOSIS — K529 Noninfective gastroenteritis and colitis, unspecified: Secondary | ICD-10-CM | POA: Diagnosis not present

## 2015-05-19 DIAGNOSIS — Z4800 Encounter for change or removal of nonsurgical wound dressing: Secondary | ICD-10-CM | POA: Diagnosis not present

## 2015-05-19 DIAGNOSIS — K659 Peritonitis, unspecified: Secondary | ICD-10-CM | POA: Diagnosis not present

## 2015-05-24 DIAGNOSIS — K7460 Unspecified cirrhosis of liver: Secondary | ICD-10-CM | POA: Diagnosis not present

## 2015-05-24 DIAGNOSIS — K3189 Other diseases of stomach and duodenum: Secondary | ICD-10-CM | POA: Diagnosis not present

## 2015-05-26 DIAGNOSIS — K659 Peritonitis, unspecified: Secondary | ICD-10-CM | POA: Diagnosis not present

## 2015-05-26 DIAGNOSIS — K529 Noninfective gastroenteritis and colitis, unspecified: Secondary | ICD-10-CM | POA: Diagnosis not present

## 2015-05-26 DIAGNOSIS — D66 Hereditary factor VIII deficiency: Secondary | ICD-10-CM | POA: Diagnosis not present

## 2015-05-26 DIAGNOSIS — F29 Unspecified psychosis not due to a substance or known physiological condition: Secondary | ICD-10-CM | POA: Diagnosis not present

## 2015-05-26 DIAGNOSIS — Z4800 Encounter for change or removal of nonsurgical wound dressing: Secondary | ICD-10-CM | POA: Diagnosis not present

## 2015-05-27 ENCOUNTER — Telehealth: Payer: Self-pay | Admitting: *Deleted

## 2015-05-27 NOTE — Telephone Encounter (Signed)
Unable to reach patient at time of pre-visit call. Left message for patient to return call when available.  

## 2015-05-30 ENCOUNTER — Encounter: Payer: Self-pay | Admitting: Family Medicine

## 2015-05-30 ENCOUNTER — Ambulatory Visit (INDEPENDENT_AMBULATORY_CARE_PROVIDER_SITE_OTHER): Payer: Commercial Managed Care - HMO | Admitting: Family Medicine

## 2015-05-30 VITALS — BP 109/65 | HR 61 | Temp 98.1°F | Ht 69.5 in | Wt 152.2 lb

## 2015-05-30 DIAGNOSIS — K7460 Unspecified cirrhosis of liver: Secondary | ICD-10-CM

## 2015-05-30 DIAGNOSIS — Z13220 Encounter for screening for lipoid disorders: Secondary | ICD-10-CM | POA: Diagnosis not present

## 2015-05-30 DIAGNOSIS — Z131 Encounter for screening for diabetes mellitus: Secondary | ICD-10-CM | POA: Diagnosis not present

## 2015-05-30 DIAGNOSIS — Z0000 Encounter for general adult medical examination without abnormal findings: Secondary | ICD-10-CM

## 2015-05-30 DIAGNOSIS — Z125 Encounter for screening for malignant neoplasm of prostate: Secondary | ICD-10-CM

## 2015-05-30 DIAGNOSIS — F32A Depression, unspecified: Secondary | ICD-10-CM

## 2015-05-30 DIAGNOSIS — K219 Gastro-esophageal reflux disease without esophagitis: Secondary | ICD-10-CM

## 2015-05-30 DIAGNOSIS — G894 Chronic pain syndrome: Secondary | ICD-10-CM

## 2015-05-30 DIAGNOSIS — F329 Major depressive disorder, single episode, unspecified: Secondary | ICD-10-CM | POA: Diagnosis not present

## 2015-05-30 DIAGNOSIS — D66 Hereditary factor VIII deficiency: Secondary | ICD-10-CM

## 2015-05-30 LAB — HEMOGLOBIN A1C: Hgb A1c MFr Bld: 5.4 % (ref 4.6–6.5)

## 2015-05-30 LAB — COMPREHENSIVE METABOLIC PANEL
ALT: 11 U/L (ref 0–53)
AST: 21 U/L (ref 0–37)
Albumin: 4.6 g/dL (ref 3.5–5.2)
Alkaline Phosphatase: 101 U/L (ref 39–117)
BUN: 11 mg/dL (ref 6–23)
CALCIUM: 9.8 mg/dL (ref 8.4–10.5)
CO2: 29 mEq/L (ref 19–32)
Chloride: 101 mEq/L (ref 96–112)
Creatinine, Ser: 0.64 mg/dL (ref 0.40–1.50)
GFR: 136.58 mL/min (ref >60.00)
GLUCOSE: 89 mg/dL (ref 70–99)
Potassium: 3.9 mEq/L (ref 3.5–5.1)
Sodium: 138 mEq/L (ref 135–145)
TOTAL PROTEIN: 7.5 g/dL (ref 6.0–8.3)
Total Bilirubin: 0.3 mg/dL (ref 0.2–1.2)

## 2015-05-30 LAB — LIPID PANEL
CHOL/HDL RATIO: 4
Cholesterol: 181 mg/dL (ref 0–200)
HDL: 46.3 mg/dL (ref >39.00)
LDL CALC: 106 mg/dL — AB (ref 0–99)
NONHDL: 134.2
Triglycerides: 141 mg/dL (ref 0.0–149.0)
VLDL: 28.2 mg/dL (ref 0.0–40.0)

## 2015-05-30 LAB — PSA: PSA: 1.04 ng/mL (ref 0.10–4.00)

## 2015-05-30 MED ORDER — PANTOPRAZOLE SODIUM 40 MG PO TBEC: 40.0000 mg | DELAYED_RELEASE_TABLET | Freq: Every day | ORAL | Status: DC

## 2015-05-30 MED ORDER — CITALOPRAM HYDROBROMIDE 40 MG PO TABS: 40.0000 mg | ORAL_TABLET | Freq: Every day | ORAL | Status: DC

## 2015-05-30 NOTE — Patient Instructions (Signed)
Great to see you today- I will be in touch with your labs asap 

## 2015-05-30 NOTE — Progress Notes (Signed)
[place] [facility] [address], [place], [place] [address] [phone]  Date:  05/30/2015   Name:  Sammy Cassar   DOB:  December 01, 1955   MRN:  [id number]  PCP:  Lamar Blinks, MD    Chief Complaint: No chief complaint on file.   History of Present Illness:  Tomi Paddock is a 60 y.o. very pleasant male patient who presents with the following:  Gentleman with a history of hemophilia and hep C s/p curative treatment, chronic pain, depression.  Here today for a CPE.  He has no particular concerns today and is doing well except for his chronic illnesses.  Colonoscopy is UTD Most recent labs in 2015. Also received a report from his hepatologist in January that showed that his hep C is cured  He uses clotting factors at home about 3x a week for his hemophilia.  He has a Picc line that has been present for 3 years  He is not fasting today He does have follow-up with the liver doctor scheduled, he does have cirrhosis as well  He declines a flu shot today Overall he is doing ok and does not have any particuluar concerns His pain is managed by Dr. Alba Cory who is his hematologist  Patient Active Problem List   Diagnosis Date Noted  . Cataract 04/28/2012  . DJD (degenerative joint disease) 04/28/2012  . Abnormality of gait 04/28/2012  . Hemophilia ([place]) 05/28/2011  . Hepatitis C 05/28/2011  . Chronic pain 05/28/2011  . Depression 05/28/2011    Past Medical History  Diagnosis Date  . Hemophilia ([place])   . Depression   . Hepatitis C   . Chronic kidney disease   . Clotting disorder [facility])     Past Surgical History  Procedure Laterality Date  . Joint replacement      Social History  Substance Use Topics  . Smoking status: Current Every Day Smoker -- 0.50 packs/day    Types: Cigarettes  . Smokeless tobacco: None  . Alcohol Use: No    Family History  Problem Relation Age of Onset  . Arthritis Mother   . Heart disease  Father   . Heart disease Maternal Grandmother   . Heart disease Maternal Grandfather   . Cancer Paternal Grandmother   . Heart disease Paternal Grandfather     Allergies  Allergen Reactions  . Chantix [Varenicline Tartrate] Other (See Comments)    Changes mood  . Wellbutrin [Bupropion Hcl] Palpitations  . Asa Arthritis Strength-Antacid [Aspirin Buffered] Other (See Comments)    Due to blood thinner  . Other     cryopercipate    Medication list has been reviewed and updated.  Current Outpatient Prescriptions on File Prior to Visit  Medication Sig Dispense Refill  . Casanthranol-Docusate Sodium 30-100 MG CAPS Take by mouth as needed.     . citalopram (CELEXA) 40 MG tablet Take 1 tablet (40 mg total) by mouth daily. 90 tablet 3  . morphine (KADIAN) 30 MG 24 hr capsule Take 30 mg by mouth daily.    Marland Kitchen morphine (MSIR) 15 MG tablet Take 15 mg by mouth every 4 (four) hours as needed.     No current facility-administered medications on file prior to visit.    Review of Systems:  As per HPI- otherwise negative.   Physical Examination: Filed Vitals:   05/30/15 1316  BP: 109/65  Pulse: 61  Temp: 98.1 F (36.7 C)   Filed Vitals:  05/30/15 1316  Height: 5' 9.5" (1.765 m)  Weight: 152 lb 3.2 oz (69.037 kg)   Body mass index is 22.16 kg/(m^2). Ideal Body Weight: Weight in (lb) to have BMI = 25: 171.4  GEN: WDWN, NAD, Non-toxic, A & O x 3, thin, pale but appears his normal self HEENT: Atraumatic, Normocephalic. Neck supple. No masses, No LAD.  Bilateral TM wnl, oropharynx normal.  PEERL,EOMI.   CV: RRR, No M/G/R. No JVD. No thrill. No extra heart sounds. PULM: CTA B, no wheezes, crackles, rhonchi. No retractions. No resp. distress. No accessory muscle use. ABD: S, NT, ND. No rebound. No HSM.  Large midline scar EXTR: No c/c/e NEURO Normal gait.  PSYCH: Normally interactive. Conversant. Not depressed or anxious appearing.  Calm demeanor.  picc line right arm  Assessment  and Plan: Physical exam  Depression - Plan: citalopram (CELEXA) 40 MG tablet  Screening for hyperlipidemia - Plan: Lipid panel  Screening for prostate cancer - Plan: PSA  Screening for diabetes mellitus - Plan: Comprehensive metabolic panel, Hemoglobin A1c  Hemophilia ([place]) - Plan: Antihemophilic Factor, Recomb, (HELIXATE FS) 3000 units KIT, Multiple Vitamin (MULTIVITAMIN) capsule, Ambulatory referral to Hematology, CANCELED: Ambulatory referral to Hematology  Chronic pain syndrome  Gastroesophageal reflux disease without esophagitis - Plan: pantoprazole (PROTONIX) 40 MG tablet  Did refills as needed, also a hematology referral needed for his insurance (no new doctors though)  He is overall doing well Here today for a CPE and also to discuss a few chronic issues as above He declines a flu shot today  Signed Lamar Blinks, MD

## 2015-06-02 DIAGNOSIS — K659 Peritonitis, unspecified: Secondary | ICD-10-CM | POA: Diagnosis not present

## 2015-06-02 DIAGNOSIS — Z4800 Encounter for change or removal of nonsurgical wound dressing: Secondary | ICD-10-CM | POA: Diagnosis not present

## 2015-06-02 DIAGNOSIS — K529 Noninfective gastroenteritis and colitis, unspecified: Secondary | ICD-10-CM | POA: Diagnosis not present

## 2015-06-02 DIAGNOSIS — F29 Unspecified psychosis not due to a substance or known physiological condition: Secondary | ICD-10-CM | POA: Diagnosis not present

## 2015-06-02 DIAGNOSIS — D66 Hereditary factor VIII deficiency: Secondary | ICD-10-CM | POA: Diagnosis not present

## 2015-06-09 DIAGNOSIS — Z4800 Encounter for change or removal of nonsurgical wound dressing: Secondary | ICD-10-CM | POA: Diagnosis not present

## 2015-06-09 DIAGNOSIS — D66 Hereditary factor VIII deficiency: Secondary | ICD-10-CM | POA: Diagnosis not present

## 2015-06-09 DIAGNOSIS — F29 Unspecified psychosis not due to a substance or known physiological condition: Secondary | ICD-10-CM | POA: Diagnosis not present

## 2015-06-09 DIAGNOSIS — K529 Noninfective gastroenteritis and colitis, unspecified: Secondary | ICD-10-CM | POA: Diagnosis not present

## 2015-06-09 DIAGNOSIS — K659 Peritonitis, unspecified: Secondary | ICD-10-CM | POA: Diagnosis not present

## 2015-06-16 DIAGNOSIS — K659 Peritonitis, unspecified: Secondary | ICD-10-CM | POA: Diagnosis not present

## 2015-06-16 DIAGNOSIS — Z4800 Encounter for change or removal of nonsurgical wound dressing: Secondary | ICD-10-CM | POA: Diagnosis not present

## 2015-06-16 DIAGNOSIS — K529 Noninfective gastroenteritis and colitis, unspecified: Secondary | ICD-10-CM | POA: Diagnosis not present

## 2015-06-16 DIAGNOSIS — D66 Hereditary factor VIII deficiency: Secondary | ICD-10-CM | POA: Diagnosis not present

## 2015-06-16 DIAGNOSIS — F29 Unspecified psychosis not due to a substance or known physiological condition: Secondary | ICD-10-CM | POA: Diagnosis not present

## 2015-06-22 DIAGNOSIS — D66 Hereditary factor VIII deficiency: Secondary | ICD-10-CM | POA: Diagnosis not present

## 2015-06-23 DIAGNOSIS — K529 Noninfective gastroenteritis and colitis, unspecified: Secondary | ICD-10-CM | POA: Diagnosis not present

## 2015-06-23 DIAGNOSIS — K659 Peritonitis, unspecified: Secondary | ICD-10-CM | POA: Diagnosis not present

## 2015-06-23 DIAGNOSIS — F29 Unspecified psychosis not due to a substance or known physiological condition: Secondary | ICD-10-CM | POA: Diagnosis not present

## 2015-06-23 DIAGNOSIS — D66 Hereditary factor VIII deficiency: Secondary | ICD-10-CM | POA: Diagnosis not present

## 2015-06-23 DIAGNOSIS — Z4800 Encounter for change or removal of nonsurgical wound dressing: Secondary | ICD-10-CM | POA: Diagnosis not present

## 2015-06-30 DIAGNOSIS — K529 Noninfective gastroenteritis and colitis, unspecified: Secondary | ICD-10-CM | POA: Diagnosis not present

## 2015-06-30 DIAGNOSIS — K659 Peritonitis, unspecified: Secondary | ICD-10-CM | POA: Diagnosis not present

## 2015-06-30 DIAGNOSIS — D66 Hereditary factor VIII deficiency: Secondary | ICD-10-CM | POA: Diagnosis not present

## 2015-06-30 DIAGNOSIS — F29 Unspecified psychosis not due to a substance or known physiological condition: Secondary | ICD-10-CM | POA: Diagnosis not present

## 2015-06-30 DIAGNOSIS — Z4800 Encounter for change or removal of nonsurgical wound dressing: Secondary | ICD-10-CM | POA: Diagnosis not present

## 2015-07-07 DIAGNOSIS — K659 Peritonitis, unspecified: Secondary | ICD-10-CM | POA: Diagnosis not present

## 2015-07-07 DIAGNOSIS — F29 Unspecified psychosis not due to a substance or known physiological condition: Secondary | ICD-10-CM | POA: Diagnosis not present

## 2015-07-07 DIAGNOSIS — K529 Noninfective gastroenteritis and colitis, unspecified: Secondary | ICD-10-CM | POA: Diagnosis not present

## 2015-07-07 DIAGNOSIS — Z4800 Encounter for change or removal of nonsurgical wound dressing: Secondary | ICD-10-CM | POA: Diagnosis not present

## 2015-07-07 DIAGNOSIS — D66 Hereditary factor VIII deficiency: Secondary | ICD-10-CM | POA: Diagnosis not present

## 2015-07-11 DIAGNOSIS — K7469 Other cirrhosis of liver: Secondary | ICD-10-CM | POA: Diagnosis not present

## 2015-07-14 DIAGNOSIS — Z4800 Encounter for change or removal of nonsurgical wound dressing: Secondary | ICD-10-CM | POA: Diagnosis not present

## 2015-07-14 DIAGNOSIS — F29 Unspecified psychosis not due to a substance or known physiological condition: Secondary | ICD-10-CM | POA: Diagnosis not present

## 2015-07-14 DIAGNOSIS — D66 Hereditary factor VIII deficiency: Secondary | ICD-10-CM | POA: Diagnosis not present

## 2015-07-14 DIAGNOSIS — K529 Noninfective gastroenteritis and colitis, unspecified: Secondary | ICD-10-CM | POA: Diagnosis not present

## 2015-07-14 DIAGNOSIS — K659 Peritonitis, unspecified: Secondary | ICD-10-CM | POA: Diagnosis not present

## 2015-07-21 DIAGNOSIS — K529 Noninfective gastroenteritis and colitis, unspecified: Secondary | ICD-10-CM | POA: Diagnosis not present

## 2015-07-21 DIAGNOSIS — K659 Peritonitis, unspecified: Secondary | ICD-10-CM | POA: Diagnosis not present

## 2015-07-21 DIAGNOSIS — D66 Hereditary factor VIII deficiency: Secondary | ICD-10-CM | POA: Diagnosis not present

## 2015-07-21 DIAGNOSIS — F29 Unspecified psychosis not due to a substance or known physiological condition: Secondary | ICD-10-CM | POA: Diagnosis not present

## 2015-07-21 DIAGNOSIS — Z4800 Encounter for change or removal of nonsurgical wound dressing: Secondary | ICD-10-CM | POA: Diagnosis not present

## 2015-07-28 DIAGNOSIS — D66 Hereditary factor VIII deficiency: Secondary | ICD-10-CM | POA: Diagnosis not present

## 2015-07-28 DIAGNOSIS — K659 Peritonitis, unspecified: Secondary | ICD-10-CM | POA: Diagnosis not present

## 2015-07-28 DIAGNOSIS — F29 Unspecified psychosis not due to a substance or known physiological condition: Secondary | ICD-10-CM | POA: Diagnosis not present

## 2015-07-28 DIAGNOSIS — K529 Noninfective gastroenteritis and colitis, unspecified: Secondary | ICD-10-CM | POA: Diagnosis not present

## 2015-07-28 DIAGNOSIS — Z4800 Encounter for change or removal of nonsurgical wound dressing: Secondary | ICD-10-CM | POA: Diagnosis not present

## 2015-08-04 DIAGNOSIS — D66 Hereditary factor VIII deficiency: Secondary | ICD-10-CM | POA: Diagnosis not present

## 2015-08-04 DIAGNOSIS — K529 Noninfective gastroenteritis and colitis, unspecified: Secondary | ICD-10-CM | POA: Diagnosis not present

## 2015-08-04 DIAGNOSIS — K659 Peritonitis, unspecified: Secondary | ICD-10-CM | POA: Diagnosis not present

## 2015-08-04 DIAGNOSIS — Z4800 Encounter for change or removal of nonsurgical wound dressing: Secondary | ICD-10-CM | POA: Diagnosis not present

## 2015-08-04 DIAGNOSIS — F29 Unspecified psychosis not due to a substance or known physiological condition: Secondary | ICD-10-CM | POA: Diagnosis not present

## 2015-08-10 ENCOUNTER — Encounter: Payer: Self-pay | Admitting: Family Medicine

## 2015-08-10 ENCOUNTER — Ambulatory Visit (INDEPENDENT_AMBULATORY_CARE_PROVIDER_SITE_OTHER): Payer: Commercial Managed Care - HMO | Admitting: Family Medicine

## 2015-08-10 VITALS — BP 117/73 | HR 62 | Temp 98.6°F | Ht 69.5 in | Wt 147.0 lb

## 2015-08-10 DIAGNOSIS — L739 Follicular disorder, unspecified: Secondary | ICD-10-CM

## 2015-08-10 MED ORDER — CEPHALEXIN 500 MG PO CAPS: 500.0000 mg | ORAL_CAPSULE | Freq: Three times a day (TID) | ORAL | Status: DC

## 2015-08-10 NOTE — Patient Instructions (Signed)
Please try the keflex antibiotic three times a day for one week for the folliculitis infection on your scalp Let me know if this is not better

## 2015-08-10 NOTE — Progress Notes (Signed)
Darren Torres [address] [place], [place] [address] [phone]  Date:  08/10/2015   Name:  Darren Torres   DOB:  Jan 31, 1956   MRN:  [id number]  PCP:  Darren Blinks, MD    Chief Complaint: Head Sore   History of Present Illness:  Darren Torres is a 60 y.o. very pleasant male patient who presents with the following:  History of hep c, hemophilia and cirrhosis.  Here today with concern of some lesions on the left side or his head, and a few that are on the top of his head.  Present for about 2 months now- some will heal and then he will get new ones They cut his hair very close and realized that he has about 20 of these on his left temple They are itchy but not painful He has not noted any pus No fevers.   No itching or tingling feeling  He is otherwise feeling well and does not have any other lesions on his body No change in his hearing or vision  Patient Active Problem List   Diagnosis Date Noted  . Hepatic cirrhosis ([place]) 05/30/2015  . Cataract 04/28/2012  . DJD (degenerative joint disease) 04/28/2012  . Abnormality of gait 04/28/2012  . Hemophilia ([place]) 05/28/2011  . Hepatitis C 05/28/2011  . Chronic pain 05/28/2011  . Depression 05/28/2011    Past Medical History  Diagnosis Date  . Hemophilia ([place])   . Depression   . Hepatitis C   . Chronic kidney disease   . Clotting disorder [facility])     Past Surgical History  Procedure Laterality Date  . Joint replacement      Social History  Substance Use Topics  . Smoking status: Current Every Day Smoker -- 0.50 packs/day    Types: Cigarettes  . Smokeless tobacco: None  . Alcohol Use: No    Family History  Problem Relation Age of Onset  . Arthritis Mother   . Heart disease Father   . Heart disease Maternal Grandmother   . Heart disease Maternal Grandfather   . Cancer Paternal Grandmother   . Heart disease Paternal Grandfather     Allergies   Allergen Reactions  . Chantix [Varenicline Tartrate] Other (See Comments)    Changes mood  . Wellbutrin [Bupropion Hcl] Palpitations  . Asa Arthritis Strength-Antacid [Aspirin Buffered] Other (See Comments)    Due to blood thinner  . Other     cryopercipate    Medication list has been reviewed and updated.  Current Outpatient Prescriptions on File Prior to Visit  Medication Sig Dispense Refill  . Antihemophilic Factor, Recomb, (HELIXATE FS) 3000 units KIT every other day.    Sarajane Marek Sodium 30-100 MG CAPS Take by mouth as needed.     . citalopram (CELEXA) 40 MG tablet Take 1 tablet (40 mg total) by mouth daily. 90 tablet 3  . morphine (KADIAN) 30 MG 24 hr capsule Take 30 mg by mouth daily.    Marland Kitchen morphine (MSIR) 15 MG tablet Take 15 mg by mouth every 4 (four) hours as needed.    . Multiple Vitamin (MULTIVITAMIN) capsule Take 1 capsule by mouth daily.    . pantoprazole (PROTONIX) 40 MG tablet Take 1 tablet (40 mg total) by mouth daily. 90 tablet 3   No current facility-administered medications on file prior to visit.    Review of Systems:  As per HPI- otherwise negative.   Physical Examination: Danley Danker  Vitals:   08/10/15 1304  BP: 117/73  Pulse: 62  Temp: 98.6 F (37 C)   Filed Vitals:   08/10/15 1304  Height: 5' 9.5" (1.765 m)  Weight: 147 lb (66.679 kg)   Body mass index is 21.4 kg/(m^2). Ideal Body Weight: Weight in (lb) to have BMI = 25: 171.4  GEN: WDWN, NAD, Non-toxic, A & O x 3, looks well, his normal self.  HEENT: Atraumatic, Normocephalic. Neck supple. No masses, No LAD.  Bilateral TM wnl, oropharynx normal.  PEERL,EOMI.   There is a cluster of about 20 lesions on the left temple and a few on the crown of the head.  Appear to be folliculitis lesions in various stages of healing.  Mild redness surrounding lesions  Ears and Nose: No external deformity. CV: RRR, No M/G/R. No JVD. No thrill. No extra heart sounds. PULM: CTA B, no wheezes, crackles,  rhonchi. No retractions. No resp. distress. No accessory muscle use. EXTR: No c/c/e NEURO Normal gait.  PSYCH: Normally interactive. Conversant. Not depressed or anxious appearing.  Calm demeanor.    Assessment and Plan: Folliculitis - Plan: cephALEXin (KEFLEX) 500 MG capsule first impression of shingles but distribution of lesions makes this unlikely Treat for folliculitis with keflex- they will let me know if this is not helpful for him  Signed Darren Blinks, MD

## 2015-08-10 NOTE — Progress Notes (Signed)
Pre visit review using our clinic review tool, if applicable. No additional management support is needed unless otherwise documented below in the visit note. 

## 2015-08-11 DIAGNOSIS — Z4800 Encounter for change or removal of nonsurgical wound dressing: Secondary | ICD-10-CM | POA: Diagnosis not present

## 2015-08-11 DIAGNOSIS — K659 Peritonitis, unspecified: Secondary | ICD-10-CM | POA: Diagnosis not present

## 2015-08-11 DIAGNOSIS — D66 Hereditary factor VIII deficiency: Secondary | ICD-10-CM | POA: Diagnosis not present

## 2015-08-11 DIAGNOSIS — F29 Unspecified psychosis not due to a substance or known physiological condition: Secondary | ICD-10-CM | POA: Diagnosis not present

## 2015-08-11 DIAGNOSIS — K529 Noninfective gastroenteritis and colitis, unspecified: Secondary | ICD-10-CM | POA: Diagnosis not present

## 2015-08-18 DIAGNOSIS — D66 Hereditary factor VIII deficiency: Secondary | ICD-10-CM | POA: Diagnosis not present

## 2015-08-18 DIAGNOSIS — K659 Peritonitis, unspecified: Secondary | ICD-10-CM | POA: Diagnosis not present

## 2015-08-18 DIAGNOSIS — K529 Noninfective gastroenteritis and colitis, unspecified: Secondary | ICD-10-CM | POA: Diagnosis not present

## 2015-08-18 DIAGNOSIS — Z4800 Encounter for change or removal of nonsurgical wound dressing: Secondary | ICD-10-CM | POA: Diagnosis not present

## 2015-08-18 DIAGNOSIS — F29 Unspecified psychosis not due to a substance or known physiological condition: Secondary | ICD-10-CM | POA: Diagnosis not present

## 2015-08-19 ENCOUNTER — Telehealth: Payer: Self-pay | Admitting: Family Medicine

## 2015-08-19 NOTE — Telephone Encounter (Signed)
Called pt- we will schedule him for a bx next week.

## 2015-08-19 NOTE — Telephone Encounter (Signed)
Can be reached: [phone] Pharmacy: [facility] [place] [facility] [address] [facility] [address]  Reason for call: pt states previous abx given are not helping and he was advised to notify Dr. Patsy Lageropland if no improvement. Requesting call back.

## 2015-08-22 ENCOUNTER — Telehealth: Payer: Self-pay | Admitting: Emergency Medicine

## 2015-08-22 NOTE — Telephone Encounter (Signed)
Called pt to schedule appt this week on Dr. Cyndie Chimeopland's schedule for skin biopsy (30 min slot if available)  left message for him to return call.

## 2015-08-22 NOTE — Telephone Encounter (Signed)
Tried to contact pt to schedule a skin biopsy appt this week on Dr. Cyndie Chimeopland's schedule (30 min slot if available). Left voicemail for pt to return the call.

## 2015-08-22 NOTE — Telephone Encounter (Signed)
Spoke to pt. Scheduled appt Thursday 08/25/15 for skin biopsy.

## 2015-08-22 NOTE — Telephone Encounter (Signed)
Spoke to pt. Skin biopsy scheduled for 08/25/2015 @ 3:45.

## 2015-08-25 ENCOUNTER — Encounter: Payer: Self-pay | Admitting: Family Medicine

## 2015-08-25 ENCOUNTER — Ambulatory Visit (INDEPENDENT_AMBULATORY_CARE_PROVIDER_SITE_OTHER): Payer: Commercial Managed Care - HMO | Admitting: Family Medicine

## 2015-08-25 VITALS — BP 124/72 | HR 63 | Temp 97.9°F | Ht 70.0 in | Wt 150.0 lb

## 2015-08-25 DIAGNOSIS — L309 Dermatitis, unspecified: Secondary | ICD-10-CM | POA: Diagnosis not present

## 2015-08-25 DIAGNOSIS — L299 Pruritus, unspecified: Secondary | ICD-10-CM

## 2015-08-25 DIAGNOSIS — R21 Rash and other nonspecific skin eruption: Secondary | ICD-10-CM | POA: Diagnosis not present

## 2015-08-25 DIAGNOSIS — L308 Other specified dermatitis: Secondary | ICD-10-CM

## 2015-08-25 MED ORDER — TRIAMCINOLONE ACETONIDE 0.1 % EX CREA: 1.0000 "application " | TOPICAL_CREAM | Freq: Two times a day (BID) | CUTANEOUS | Status: DC

## 2015-08-25 NOTE — Patient Instructions (Signed)
I will send your sample off to pathology for evaluation Let me know if you have any sign of infection or prolonged bleeding In the meantime start using the triamcinolone cream on the rash twice a day- avoid putting it on your face I will be in touch with your pathology results asap

## 2015-08-25 NOTE — Progress Notes (Signed)
Pre visit review using our clinic review tool, if applicable. No additional management support is needed unless otherwise documented below in the visit note. 

## 2015-08-25 NOTE — Progress Notes (Addendum)
[place] at [facility] [address], [place], [place] [address] [phone]  Date:  08/25/2015   Name:  Darren Torres   DOB:  [birth year]   MRN:  [id number]  PCP:  Lamar Blinks, MD    Chief Complaint: Skin Biopsy   History of Present Illness:  Darren Torres is a 60 y.o. very pleasant male patient who presents with the following:  Seen here about 2 weeks ago with an uitchy rash on his head and arms.  We tried keflex but it did not go away Elected to do a punch bx in hopes of getting a dx for him today He is otherwise feeling well No fevers He does have hemophilia but receives therapeutic clotting factors on a regular basis  Patient Active Problem List   Diagnosis Date Noted  . Hepatic cirrhosis ([place]) 05/30/2015  . Cataract 04/28/2012  . DJD (degenerative joint disease) 04/28/2012  . Abnormality of gait 04/28/2012  . Hemophilia ([place]) 05/28/2011  . Hepatitis C 05/28/2011  . Chronic pain 05/28/2011  . Depression 05/28/2011    Past Medical History  Diagnosis Date  . Hemophilia ([place])   . Depression   . Hepatitis C   . Chronic kidney disease   . Clotting disorder [facility])     Past Surgical History  Procedure Laterality Date  . Joint replacement      Social History  Substance Use Topics  . Smoking status: Current Every Day Smoker -- 0.50 packs/day    Types: Cigarettes  . Smokeless tobacco: None  . Alcohol Use: No    Family History  Problem Relation Age of Onset  . Arthritis Mother   . Heart disease Father   . Heart disease Maternal Grandmother   . Heart disease Maternal Grandfather   . Cancer Paternal Grandmother   . Heart disease Paternal Grandfather     Allergies  Allergen Reactions  . Chantix [Varenicline Tartrate] Other (See Comments)    Changes mood  . Wellbutrin [Bupropion Hcl] Palpitations  . Asa Arthritis Strength-Antacid [Aspirin Buffered] Other (See Comments)    Due to blood thinner  .  Other     cryopercipate    Medication list has been reviewed and updated.  Current Outpatient Prescriptions on File Prior to Visit  Medication Sig Dispense Refill  . Antihemophilic Factor, Recomb, (HELIXATE FS) 3000 units KIT every other day.    Sarajane Marek Sodium 30-100 MG CAPS Take by mouth as needed.     . cephALEXin (KEFLEX) 500 MG capsule Take 1 capsule (500 mg total) by mouth 3 (three) times daily. 21 capsule 0  . citalopram (CELEXA) 40 MG tablet Take 1 tablet (40 mg total) by mouth daily. 90 tablet 3  . morphine (KADIAN) 30 MG 24 hr capsule Take 30 mg by mouth daily.    Marland Kitchen morphine (MSIR) 15 MG tablet Take 15 mg by mouth every 4 (four) hours as needed.    . Multiple Vitamin (MULTIVITAMIN) capsule Take 1 capsule by mouth daily.    . pantoprazole (PROTONIX) 40 MG tablet Take 1 tablet (40 mg total) by mouth daily. 90 tablet 3   No current facility-administered medications on file prior to visit.    Review of Systems:  As per HPI- otherwise negative.   Physical Examination: Filed Vitals:   08/25/15 1557  BP: 124/72  Pulse: 63  Temp: 97.9 F (36.6 C)   Filed Vitals:   08/25/15 1557  Height: '5\' 10"'  (1.778 m)  Weight: 150 lb (68.04 kg)   Body mass index is 21.52 kg/(m^2). Ideal Body Weight: Weight in (lb) to have BMI = 25: 173.9  GEN: WDWN, NAD, Non-toxic, A & O x 3 HEENT: Atraumatic, Normocephalic. Neck supple. No masses, No LAD. Ears and Nose: No external deformity. CV: RRR, No M/G/R. No JVD. No thrill. No extra heart sounds. PULM: CTA B, no wheezes, crackles, rhonchi. No retractions. No resp. distress. No accessory muscle use. Port right arm EXTR: No c/c/e NEURO Normal gait.  PSYCH: Normally interactive. Conversant. Not depressed or anxious appearing.  Calm demeanor. He has clustered discrete papules that are beginning to dry out and appear more c/w a spongiotic derm on his left temple and on both forearms   VC obtained for punch bx. Prepped with  betadine and alcohol Anesthesia with 2% lido 56m.  347mpunch taken from site on left forearm and sent to path Applied pressure until hemostasis and observed pt in clinic for 10 min after procedure; he did not have any prolonged or recurrent bleeding from the site  Assessment and Plan: Rash and nonspecific skin eruption - Plan: Dermatology pathology, triamcinolone cream (KENALOG) 0.1 %  Itching - Plan: Dermatology pathology, triamcinolone cream (KENALOG) 0.1 %  Punch bx as above While we await results will start on triamcinolone  Signed JeLamar BlinksMD 6/20 Spongiotic derm by path results.  Called pt- he is better as far as his arms, but scalp lesions persist.  Will try derma- smoothe scalp oil for him and he will keep me posted

## 2015-08-26 DIAGNOSIS — K659 Peritonitis, unspecified: Secondary | ICD-10-CM | POA: Diagnosis not present

## 2015-08-26 DIAGNOSIS — K529 Noninfective gastroenteritis and colitis, unspecified: Secondary | ICD-10-CM | POA: Diagnosis not present

## 2015-08-26 DIAGNOSIS — F29 Unspecified psychosis not due to a substance or known physiological condition: Secondary | ICD-10-CM | POA: Diagnosis not present

## 2015-08-26 DIAGNOSIS — D66 Hereditary factor VIII deficiency: Secondary | ICD-10-CM | POA: Diagnosis not present

## 2015-08-26 DIAGNOSIS — Z4800 Encounter for change or removal of nonsurgical wound dressing: Secondary | ICD-10-CM | POA: Diagnosis not present

## 2015-08-31 DIAGNOSIS — K219 Gastro-esophageal reflux disease without esophagitis: Secondary | ICD-10-CM | POA: Diagnosis not present

## 2015-08-31 DIAGNOSIS — Z9049 Acquired absence of other specified parts of digestive tract: Secondary | ICD-10-CM | POA: Diagnosis not present

## 2015-08-31 DIAGNOSIS — F329 Major depressive disorder, single episode, unspecified: Secondary | ICD-10-CM | POA: Diagnosis not present

## 2015-08-31 DIAGNOSIS — D66 Hereditary factor VIII deficiency: Secondary | ICD-10-CM | POA: Diagnosis not present

## 2015-08-31 DIAGNOSIS — R21 Rash and other nonspecific skin eruption: Secondary | ICD-10-CM | POA: Diagnosis not present

## 2015-08-31 DIAGNOSIS — M064 Inflammatory polyarthropathy: Secondary | ICD-10-CM | POA: Diagnosis not present

## 2015-09-01 ENCOUNTER — Telehealth: Payer: Self-pay | Admitting: Emergency Medicine

## 2015-09-01 DIAGNOSIS — K659 Peritonitis, unspecified: Secondary | ICD-10-CM | POA: Diagnosis not present

## 2015-09-01 DIAGNOSIS — F29 Unspecified psychosis not due to a substance or known physiological condition: Secondary | ICD-10-CM | POA: Diagnosis not present

## 2015-09-01 DIAGNOSIS — K529 Noninfective gastroenteritis and colitis, unspecified: Secondary | ICD-10-CM | POA: Diagnosis not present

## 2015-09-01 DIAGNOSIS — D66 Hereditary factor VIII deficiency: Secondary | ICD-10-CM | POA: Diagnosis not present

## 2015-09-01 DIAGNOSIS — Z4800 Encounter for change or removal of nonsurgical wound dressing: Secondary | ICD-10-CM | POA: Diagnosis not present

## 2015-09-01 NOTE — Telephone Encounter (Signed)
Spoke to pt, pathology results given.Pt states that rash on arm is just about gone but now has new rash on right arm and a new place on his head.Discussed provider recommendations.  Pt verbalized understanding.

## 2015-09-06 ENCOUNTER — Telehealth: Payer: Self-pay | Admitting: Family Medicine

## 2015-09-06 MED ORDER — DERMA-SMOOTHE/FS BODY 0.01 % EX OIL: TOPICAL_OIL | CUTANEOUS | Status: DC

## 2015-09-06 NOTE — Addendum Note (Signed)
Addended by: Abbe AmsterdamOPLAND, Levante Simones C on: 09/06/2015 05:06 PM   Modules accepted: Orders

## 2015-09-06 NOTE — Telephone Encounter (Signed)
error 

## 2015-09-08 DIAGNOSIS — K529 Noninfective gastroenteritis and colitis, unspecified: Secondary | ICD-10-CM | POA: Diagnosis not present

## 2015-09-08 DIAGNOSIS — D66 Hereditary factor VIII deficiency: Secondary | ICD-10-CM | POA: Diagnosis not present

## 2015-09-08 DIAGNOSIS — F29 Unspecified psychosis not due to a substance or known physiological condition: Secondary | ICD-10-CM | POA: Diagnosis not present

## 2015-09-08 DIAGNOSIS — K659 Peritonitis, unspecified: Secondary | ICD-10-CM | POA: Diagnosis not present

## 2015-09-08 DIAGNOSIS — Z4800 Encounter for change or removal of nonsurgical wound dressing: Secondary | ICD-10-CM | POA: Diagnosis not present

## 2015-09-13 ENCOUNTER — Other Ambulatory Visit: Payer: Self-pay | Admitting: Family Medicine

## 2015-09-14 DIAGNOSIS — D66 Hereditary factor VIII deficiency: Secondary | ICD-10-CM | POA: Diagnosis not present

## 2015-09-15 DIAGNOSIS — K529 Noninfective gastroenteritis and colitis, unspecified: Secondary | ICD-10-CM | POA: Diagnosis not present

## 2015-09-15 DIAGNOSIS — Z4800 Encounter for change or removal of nonsurgical wound dressing: Secondary | ICD-10-CM | POA: Diagnosis not present

## 2015-09-15 DIAGNOSIS — F29 Unspecified psychosis not due to a substance or known physiological condition: Secondary | ICD-10-CM | POA: Diagnosis not present

## 2015-09-15 DIAGNOSIS — D66 Hereditary factor VIII deficiency: Secondary | ICD-10-CM | POA: Diagnosis not present

## 2015-09-15 DIAGNOSIS — K659 Peritonitis, unspecified: Secondary | ICD-10-CM | POA: Diagnosis not present

## 2015-09-22 DIAGNOSIS — Z4800 Encounter for change or removal of nonsurgical wound dressing: Secondary | ICD-10-CM | POA: Diagnosis not present

## 2015-09-22 DIAGNOSIS — D66 Hereditary factor VIII deficiency: Secondary | ICD-10-CM | POA: Diagnosis not present

## 2015-09-22 DIAGNOSIS — K529 Noninfective gastroenteritis and colitis, unspecified: Secondary | ICD-10-CM | POA: Diagnosis not present

## 2015-09-22 DIAGNOSIS — F29 Unspecified psychosis not due to a substance or known physiological condition: Secondary | ICD-10-CM | POA: Diagnosis not present

## 2015-09-22 DIAGNOSIS — K659 Peritonitis, unspecified: Secondary | ICD-10-CM | POA: Diagnosis not present

## 2015-09-27 ENCOUNTER — Telehealth: Payer: Self-pay | Admitting: Family Medicine

## 2015-09-27 NOTE — Telephone Encounter (Signed)
Pt called in because he says that he was seen by PCP and was prescribed a ointment to take. Pt says that ointment isn't working and would like to know if he could have something different for his blisters?   Pharmacy: [facility] [place] - [address]    [phone]

## 2015-09-27 NOTE — Telephone Encounter (Signed)
Patient would like to know if her could be prescribed Ketoconazole for his blisters. Please advise.

## 2015-09-28 MED ORDER — KETOCONAZOLE 2 % EX CREA: TOPICAL_CREAM | CUTANEOUS | Status: DC

## 2015-09-28 NOTE — Telephone Encounter (Signed)
Called him back- he would like to try ketoconazole topical. Explained that I am not sure if this will help but I do not think it will be harmful. Suggested that we refer him to dermatology- he would prefer to try the ketoconazole first but will let me know if not helpful Recent bx showed spongiotic derm

## 2015-09-29 DIAGNOSIS — D66 Hereditary factor VIII deficiency: Secondary | ICD-10-CM | POA: Diagnosis not present

## 2015-09-29 DIAGNOSIS — K659 Peritonitis, unspecified: Secondary | ICD-10-CM | POA: Diagnosis not present

## 2015-09-29 DIAGNOSIS — Z4800 Encounter for change or removal of nonsurgical wound dressing: Secondary | ICD-10-CM | POA: Diagnosis not present

## 2015-09-29 DIAGNOSIS — F29 Unspecified psychosis not due to a substance or known physiological condition: Secondary | ICD-10-CM | POA: Diagnosis not present

## 2015-09-29 DIAGNOSIS — K529 Noninfective gastroenteritis and colitis, unspecified: Secondary | ICD-10-CM | POA: Diagnosis not present

## 2015-10-06 DIAGNOSIS — Z4800 Encounter for change or removal of nonsurgical wound dressing: Secondary | ICD-10-CM | POA: Diagnosis not present

## 2015-10-06 DIAGNOSIS — D66 Hereditary factor VIII deficiency: Secondary | ICD-10-CM | POA: Diagnosis not present

## 2015-10-06 DIAGNOSIS — F29 Unspecified psychosis not due to a substance or known physiological condition: Secondary | ICD-10-CM | POA: Diagnosis not present

## 2015-10-06 DIAGNOSIS — K529 Noninfective gastroenteritis and colitis, unspecified: Secondary | ICD-10-CM | POA: Diagnosis not present

## 2015-10-06 DIAGNOSIS — K659 Peritonitis, unspecified: Secondary | ICD-10-CM | POA: Diagnosis not present

## 2015-10-13 DIAGNOSIS — D66 Hereditary factor VIII deficiency: Secondary | ICD-10-CM | POA: Diagnosis not present

## 2015-10-13 DIAGNOSIS — K659 Peritonitis, unspecified: Secondary | ICD-10-CM | POA: Diagnosis not present

## 2015-10-13 DIAGNOSIS — F29 Unspecified psychosis not due to a substance or known physiological condition: Secondary | ICD-10-CM | POA: Diagnosis not present

## 2015-10-13 DIAGNOSIS — K529 Noninfective gastroenteritis and colitis, unspecified: Secondary | ICD-10-CM | POA: Diagnosis not present

## 2015-10-13 DIAGNOSIS — Z4800 Encounter for change or removal of nonsurgical wound dressing: Secondary | ICD-10-CM | POA: Diagnosis not present

## 2015-10-15 ENCOUNTER — Other Ambulatory Visit: Payer: Self-pay | Admitting: Family Medicine

## 2015-10-18 NOTE — Telephone Encounter (Signed)
Received refill request for Ketoconazole cream. Pt last seen 08/25/15, last refill 09/28/15. Is it ok to refill? Please advise. Thanks

## 2015-10-25 ENCOUNTER — Encounter: Payer: Self-pay | Admitting: Family Medicine

## 2015-11-03 DIAGNOSIS — Z4800 Encounter for change or removal of nonsurgical wound dressing: Secondary | ICD-10-CM | POA: Diagnosis not present

## 2015-11-03 DIAGNOSIS — F29 Unspecified psychosis not due to a substance or known physiological condition: Secondary | ICD-10-CM | POA: Diagnosis not present

## 2015-11-03 DIAGNOSIS — K659 Peritonitis, unspecified: Secondary | ICD-10-CM | POA: Diagnosis not present

## 2015-11-03 DIAGNOSIS — D66 Hereditary factor VIII deficiency: Secondary | ICD-10-CM | POA: Diagnosis not present

## 2015-11-03 DIAGNOSIS — K529 Noninfective gastroenteritis and colitis, unspecified: Secondary | ICD-10-CM | POA: Diagnosis not present

## 2015-11-24 DIAGNOSIS — F29 Unspecified psychosis not due to a substance or known physiological condition: Secondary | ICD-10-CM | POA: Diagnosis not present

## 2015-11-24 DIAGNOSIS — K529 Noninfective gastroenteritis and colitis, unspecified: Secondary | ICD-10-CM | POA: Diagnosis not present

## 2015-11-24 DIAGNOSIS — Z4800 Encounter for change or removal of nonsurgical wound dressing: Secondary | ICD-10-CM | POA: Diagnosis not present

## 2015-11-24 DIAGNOSIS — D66 Hereditary factor VIII deficiency: Secondary | ICD-10-CM | POA: Diagnosis not present

## 2015-11-24 DIAGNOSIS — K659 Peritonitis, unspecified: Secondary | ICD-10-CM | POA: Diagnosis not present

## 2015-11-30 ENCOUNTER — Ambulatory Visit: Payer: Commercial Managed Care - HMO | Admitting: Family Medicine

## 2015-11-30 ENCOUNTER — Encounter: Payer: Self-pay | Admitting: Family Medicine

## 2015-11-30 DIAGNOSIS — Z0289 Encounter for other administrative examinations: Secondary | ICD-10-CM

## 2015-12-15 DIAGNOSIS — F29 Unspecified psychosis not due to a substance or known physiological condition: Secondary | ICD-10-CM | POA: Diagnosis not present

## 2015-12-15 DIAGNOSIS — D66 Hereditary factor VIII deficiency: Secondary | ICD-10-CM | POA: Diagnosis not present

## 2015-12-15 DIAGNOSIS — Z4800 Encounter for change or removal of nonsurgical wound dressing: Secondary | ICD-10-CM | POA: Diagnosis not present

## 2015-12-15 DIAGNOSIS — K659 Peritonitis, unspecified: Secondary | ICD-10-CM | POA: Diagnosis not present

## 2015-12-15 DIAGNOSIS — K529 Noninfective gastroenteritis and colitis, unspecified: Secondary | ICD-10-CM | POA: Diagnosis not present

## 2015-12-22 ENCOUNTER — Encounter: Payer: Self-pay | Admitting: Family Medicine

## 2015-12-22 ENCOUNTER — Ambulatory Visit (INDEPENDENT_AMBULATORY_CARE_PROVIDER_SITE_OTHER): Payer: Commercial Managed Care - HMO | Admitting: Family Medicine

## 2015-12-22 VITALS — BP 128/60 | HR 64 | Temp 98.3°F | Ht 70.0 in

## 2015-12-22 DIAGNOSIS — K7460 Unspecified cirrhosis of liver: Secondary | ICD-10-CM | POA: Diagnosis not present

## 2015-12-22 DIAGNOSIS — B1920 Unspecified viral hepatitis C without hepatic coma: Secondary | ICD-10-CM

## 2015-12-22 DIAGNOSIS — F29 Unspecified psychosis not due to a substance or known physiological condition: Secondary | ICD-10-CM | POA: Diagnosis not present

## 2015-12-22 DIAGNOSIS — K529 Noninfective gastroenteritis and colitis, unspecified: Secondary | ICD-10-CM | POA: Diagnosis not present

## 2015-12-22 DIAGNOSIS — D66 Hereditary factor VIII deficiency: Secondary | ICD-10-CM

## 2015-12-22 DIAGNOSIS — K659 Peritonitis, unspecified: Secondary | ICD-10-CM | POA: Diagnosis not present

## 2015-12-22 DIAGNOSIS — L308 Other specified dermatitis: Secondary | ICD-10-CM

## 2015-12-22 DIAGNOSIS — Z4800 Encounter for change or removal of nonsurgical wound dressing: Secondary | ICD-10-CM | POA: Diagnosis not present

## 2015-12-22 NOTE — Patient Instructions (Signed)
It was nice to see you again today- your blood pressure looks good.  Take care and let's plan to see you in about 6 months

## 2015-12-22 NOTE — Progress Notes (Signed)
[place] at [facility] [address] [place], [place] [address] [phone]  Date:  12/22/2015   Name:  Darren Torres   DOB:  [birth year]   MRN:  [id number]  PCP:  Lamar Blinks, MD    Chief Complaint: No chief complaint on file.   History of Present Illness:  Darren Torres is a 60 y.o. very pleasant male patient who presents with the following:  Last seen here in June for a skin rash- bx was performed and demonstrated spongiotic derm.  His rash did get better but he continues to have some excorieted and inflamed areas on his right upper arm and left hairline.  History of hep c, hemophilia, cirrhosis.   Due for a flu shot but he declines.   Recently returned from a long visit to see his mom and help her with some fixing up areound her home  Last labs in March- CMP, lipids and A1c, PSA looked ok  His hematologist is at [facility]- he started a new medication for his hemophilia.  He will see hematology soon for a follow-up but so far the new treatment is working well for him His Hep C is now considered cure via Harvoni treatment!  Wt Readings from Last 3 Encounters:  08/25/15 150 lb (68 kg)  08/10/15 147 lb (66.7 kg)  05/30/15 152 lb 3.2 oz (69 kg)    BP Readings from Last 3 Encounters:  12/22/15 128/60  08/25/15 124/72  08/10/15 117/73    Patient Active Problem List   Diagnosis Date Noted  . Hepatic cirrhosis ([place]) 05/30/2015  . Cataract 04/28/2012  . DJD (degenerative joint disease) 04/28/2012  . Abnormality of gait 04/28/2012  . Hemophilia ([place]) 05/28/2011  . Hepatitis C 05/28/2011  . Chronic pain 05/28/2011  . Depression 05/28/2011    Past Medical History:  Diagnosis Date  . Chronic kidney disease   . Clotting disorder ([place])   . Depression   . Hemophilia ([place])   . Hepatitis C     Past Surgical History:  Procedure Laterality Date  . JOINT REPLACEMENT      Social History  Substance Use Topics   . Smoking status: Current Every Day Smoker    Packs/day: 0.50    Types: Cigarettes  . Smokeless tobacco: Not on file  . Alcohol use No    Family History  Problem Relation Age of Onset  . Arthritis Mother   . Heart disease Father   . Heart disease Maternal Grandmother   . Heart disease Maternal Grandfather   . Cancer Paternal Grandmother   . Heart disease Paternal Grandfather     Allergies  Allergen Reactions  . Chantix [Varenicline Tartrate] Other (See Comments)    Changes mood  . Wellbutrin [Bupropion Hcl] Palpitations  . Asa Arthritis Strength-Antacid [Aspirin Buffered] Other (See Comments)    Due to blood thinner  . Other     cryopercipate    Medication list has been reviewed and updated.  Current Outpatient Prescriptions on File Prior to Visit  Medication Sig Dispense Refill  . Antihemophilic Factor, Recomb, (HELIXATE FS) 3000 units KIT every other day.    Sarajane Marek Sodium 30-100 MG CAPS Take by mouth as needed.     . cephALEXin (KEFLEX) 500 MG capsule Take 1 capsule (500 mg total) by mouth 3 (three) times daily. 21 capsule 0  . citalopram (CELEXA) 40 MG tablet Take 1 tablet (40 mg total) by mouth daily. 90 tablet  3  . Fluocinolone Acetonide (DERMA-SMOOTHE/FS BODY) 0.01 % OIL Apply to scalp daily as needed for irriatation 118.28 mL 1  . ketoconazole (NIZORAL) 2 % cream APPLY twice WEEKLY FOR one MONTH, THEN AS NEEDED 60 g 1  . morphine (KADIAN) 30 MG 24 hr capsule Take 30 mg by mouth daily.    Marland Kitchen morphine (MSIR) 15 MG tablet Take 15 mg by mouth every 4 (four) hours as needed.    . Multiple Vitamin (MULTIVITAMIN) capsule Take 1 capsule by mouth daily.    . pantoprazole (PROTONIX) 40 MG tablet Take 1 tablet (40 mg total) by mouth daily. 90 tablet 3  . triamcinolone cream (KENALOG) 0.1 % APPLY TOPICALLY TO THE AFFECTED AREA(S) TWICE DAILY. USE AS NEEDED FOR TO RASH AND FOR ITCHING 45 g 0   No current facility-administered medications on file prior to visit.      Review of Systems:  As per HPI- otherwise negative.  No fever, chills, vomiting, chest pain, SOB or bleeding problms    Physical Examination: Vitals:   12/22/15 0924  Height: '5\' 10"'  (1.778 m)  Blood pressure 128/60, pulse 64, temperature 98.3 F (36.8 C), temperature source Oral, height '5\' 10"'  (1.778 m), SpO2 98 %.   Ideal Body Weight:    GEN: WDWN, NAD, Non-toxic, A & O x 3, thin build, looks well HEENT: Atraumatic, Normocephalic. Neck supple. No masses, No LAD. Ears and Nose: No external deformity. CV: RRR, No M/G/R. No JVD. No thrill. No extra heart sounds. PULM: CTA B, no wheezes, crackles, rhonchi. No retractions. No resp. distress. No accessory muscle use. ABD: S, NT, ND. No rebound. No HSM. EXTR: No c/c/e NEURO Normal gait.  PSYCH: Normally interactive. Conversant. Not depressed or anxious appearing.  Calm demeanor.  Eczema like derm on the right upper arm and left hairline   Assessment and Plan: Spongiotic dermatitis  Cirrhosis of liver without ascites, unspecified hepatic cirrhosis type ([place])  Hemophilia ([facility])  Here today for a follow-up visit He is doing well and does not have any special concerns for me today We will update his problem list to reflect his hep c being cured He will continue to use topical triamcinolone as needed for his rash BP well controlled today He feels that his mood is good with current dose of celexa   Will return in 3-6 months fora recheck and labs- labs done in March looked fine  Results for orders placed or performed in visit on 05/30/15  Comprehensive metabolic panel  Result Value Ref Range   Sodium 138 135 - 145 mEq/L   Potassium 3.9 3.5 - 5.1 mEq/L   Chloride 101 96 - 112 mEq/L   CO2 29 19 - 32 mEq/L   Glucose, Bld 89 70 - 99 mg/dL   BUN 11 6 - 23 mg/dL   Creatinine, Ser 0.64 0.40 - 1.50 mg/dL   Total Bilirubin 0.3 0.2 - 1.2 mg/dL   Alkaline Phosphatase 101 39 - 117 U/L   AST 21 0 - 37 U/L   ALT 11 0 - 53 U/L    Total Protein 7.5 6.0 - 8.3 g/dL   Albumin 4.6 3.5 - 5.2 g/dL   Calcium 9.8 8.4 - 10.5 mg/dL   GFR 136.58 >60.00 mL/min  Lipid panel  Result Value Ref Range   Cholesterol 181 0 - 200 mg/dL   Triglycerides 141.0 0.0 - 149.0 mg/dL   HDL 46.30 >39.00 mg/dL   VLDL 28.2 0.0 - 40.0 mg/dL   LDL Cholesterol  106 (H) 0 - 99 mg/dL   Total CHOL/HDL Ratio 4    NonHDL 134.20   PSA  Result Value Ref Range   PSA 1.04 0.10 - 4.00 ng/mL  Hemoglobin A1c  Result Value Ref Range   Hgb A1c MFr Bld 5.4 4.6 - 6.5 %        Signed Lamar Blinks, MD

## 2015-12-22 NOTE — Progress Notes (Signed)
Pre visit review using our clinic review tool, if applicable. No additional management support is needed unless otherwise documented below in the visit note. 

## 2015-12-29 DIAGNOSIS — K529 Noninfective gastroenteritis and colitis, unspecified: Secondary | ICD-10-CM | POA: Diagnosis not present

## 2015-12-29 DIAGNOSIS — K659 Peritonitis, unspecified: Secondary | ICD-10-CM | POA: Diagnosis not present

## 2015-12-29 DIAGNOSIS — D66 Hereditary factor VIII deficiency: Secondary | ICD-10-CM | POA: Diagnosis not present

## 2015-12-29 DIAGNOSIS — F29 Unspecified psychosis not due to a substance or known physiological condition: Secondary | ICD-10-CM | POA: Diagnosis not present

## 2015-12-29 DIAGNOSIS — Z4800 Encounter for change or removal of nonsurgical wound dressing: Secondary | ICD-10-CM | POA: Diagnosis not present

## 2016-01-04 DIAGNOSIS — K529 Noninfective gastroenteritis and colitis, unspecified: Secondary | ICD-10-CM | POA: Diagnosis not present

## 2016-01-04 DIAGNOSIS — F29 Unspecified psychosis not due to a substance or known physiological condition: Secondary | ICD-10-CM | POA: Diagnosis not present

## 2016-01-04 DIAGNOSIS — Z4800 Encounter for change or removal of nonsurgical wound dressing: Secondary | ICD-10-CM | POA: Diagnosis not present

## 2016-01-04 DIAGNOSIS — D66 Hereditary factor VIII deficiency: Secondary | ICD-10-CM | POA: Diagnosis not present

## 2016-01-04 DIAGNOSIS — K659 Peritonitis, unspecified: Secondary | ICD-10-CM | POA: Diagnosis not present

## 2016-01-12 DIAGNOSIS — D66 Hereditary factor VIII deficiency: Secondary | ICD-10-CM | POA: Diagnosis not present

## 2016-01-12 DIAGNOSIS — K529 Noninfective gastroenteritis and colitis, unspecified: Secondary | ICD-10-CM | POA: Diagnosis not present

## 2016-01-12 DIAGNOSIS — Z4800 Encounter for change or removal of nonsurgical wound dressing: Secondary | ICD-10-CM | POA: Diagnosis not present

## 2016-01-12 DIAGNOSIS — F29 Unspecified psychosis not due to a substance or known physiological condition: Secondary | ICD-10-CM | POA: Diagnosis not present

## 2016-01-12 DIAGNOSIS — K659 Peritonitis, unspecified: Secondary | ICD-10-CM | POA: Diagnosis not present

## 2016-01-19 DIAGNOSIS — F29 Unspecified psychosis not due to a substance or known physiological condition: Secondary | ICD-10-CM | POA: Diagnosis not present

## 2016-01-19 DIAGNOSIS — K659 Peritonitis, unspecified: Secondary | ICD-10-CM | POA: Diagnosis not present

## 2016-01-19 DIAGNOSIS — Z4800 Encounter for change or removal of nonsurgical wound dressing: Secondary | ICD-10-CM | POA: Diagnosis not present

## 2016-01-19 DIAGNOSIS — K529 Noninfective gastroenteritis and colitis, unspecified: Secondary | ICD-10-CM | POA: Diagnosis not present

## 2016-01-19 DIAGNOSIS — D66 Hereditary factor VIII deficiency: Secondary | ICD-10-CM | POA: Diagnosis not present

## 2016-01-26 DIAGNOSIS — K659 Peritonitis, unspecified: Secondary | ICD-10-CM | POA: Diagnosis not present

## 2016-01-26 DIAGNOSIS — K529 Noninfective gastroenteritis and colitis, unspecified: Secondary | ICD-10-CM | POA: Diagnosis not present

## 2016-01-26 DIAGNOSIS — F29 Unspecified psychosis not due to a substance or known physiological condition: Secondary | ICD-10-CM | POA: Diagnosis not present

## 2016-01-26 DIAGNOSIS — D66 Hereditary factor VIII deficiency: Secondary | ICD-10-CM | POA: Diagnosis not present

## 2016-01-26 DIAGNOSIS — Z4800 Encounter for change or removal of nonsurgical wound dressing: Secondary | ICD-10-CM | POA: Diagnosis not present

## 2016-02-02 DIAGNOSIS — D66 Hereditary factor VIII deficiency: Secondary | ICD-10-CM | POA: Diagnosis not present

## 2016-02-02 DIAGNOSIS — K529 Noninfective gastroenteritis and colitis, unspecified: Secondary | ICD-10-CM | POA: Diagnosis not present

## 2016-02-02 DIAGNOSIS — Z4800 Encounter for change or removal of nonsurgical wound dressing: Secondary | ICD-10-CM | POA: Diagnosis not present

## 2016-02-02 DIAGNOSIS — K659 Peritonitis, unspecified: Secondary | ICD-10-CM | POA: Diagnosis not present

## 2016-02-02 DIAGNOSIS — F29 Unspecified psychosis not due to a substance or known physiological condition: Secondary | ICD-10-CM | POA: Diagnosis not present

## 2016-02-08 DIAGNOSIS — Z4800 Encounter for change or removal of nonsurgical wound dressing: Secondary | ICD-10-CM | POA: Diagnosis not present

## 2016-02-08 DIAGNOSIS — K529 Noninfective gastroenteritis and colitis, unspecified: Secondary | ICD-10-CM | POA: Diagnosis not present

## 2016-02-08 DIAGNOSIS — D66 Hereditary factor VIII deficiency: Secondary | ICD-10-CM | POA: Diagnosis not present

## 2016-02-08 DIAGNOSIS — F29 Unspecified psychosis not due to a substance or known physiological condition: Secondary | ICD-10-CM | POA: Diagnosis not present

## 2016-02-08 DIAGNOSIS — K659 Peritonitis, unspecified: Secondary | ICD-10-CM | POA: Diagnosis not present

## 2016-02-16 DIAGNOSIS — K659 Peritonitis, unspecified: Secondary | ICD-10-CM | POA: Diagnosis not present

## 2016-02-16 DIAGNOSIS — K529 Noninfective gastroenteritis and colitis, unspecified: Secondary | ICD-10-CM | POA: Diagnosis not present

## 2016-02-16 DIAGNOSIS — Z4800 Encounter for change or removal of nonsurgical wound dressing: Secondary | ICD-10-CM | POA: Diagnosis not present

## 2016-02-16 DIAGNOSIS — F29 Unspecified psychosis not due to a substance or known physiological condition: Secondary | ICD-10-CM | POA: Diagnosis not present

## 2016-02-16 DIAGNOSIS — D66 Hereditary factor VIII deficiency: Secondary | ICD-10-CM | POA: Diagnosis not present

## 2016-02-21 ENCOUNTER — Other Ambulatory Visit: Payer: Self-pay | Admitting: Family Medicine

## 2016-02-21 DIAGNOSIS — F329 Major depressive disorder, single episode, unspecified: Secondary | ICD-10-CM

## 2016-02-21 DIAGNOSIS — K219 Gastro-esophageal reflux disease without esophagitis: Secondary | ICD-10-CM

## 2016-02-21 DIAGNOSIS — F32A Depression, unspecified: Secondary | ICD-10-CM

## 2016-02-22 DIAGNOSIS — D66 Hereditary factor VIII deficiency: Secondary | ICD-10-CM | POA: Diagnosis not present

## 2016-02-22 DIAGNOSIS — D759 Disease of blood and blood-forming organs, unspecified: Secondary | ICD-10-CM | POA: Diagnosis not present

## 2016-02-22 DIAGNOSIS — R21 Rash and other nonspecific skin eruption: Secondary | ICD-10-CM | POA: Diagnosis not present

## 2016-02-22 DIAGNOSIS — M363 Arthropathy in other blood disorders: Secondary | ICD-10-CM | POA: Diagnosis not present

## 2016-02-23 DIAGNOSIS — K659 Peritonitis, unspecified: Secondary | ICD-10-CM | POA: Diagnosis not present

## 2016-02-23 DIAGNOSIS — K529 Noninfective gastroenteritis and colitis, unspecified: Secondary | ICD-10-CM | POA: Diagnosis not present

## 2016-02-23 DIAGNOSIS — F29 Unspecified psychosis not due to a substance or known physiological condition: Secondary | ICD-10-CM | POA: Diagnosis not present

## 2016-02-23 DIAGNOSIS — Z4800 Encounter for change or removal of nonsurgical wound dressing: Secondary | ICD-10-CM | POA: Diagnosis not present

## 2016-02-23 DIAGNOSIS — D66 Hereditary factor VIII deficiency: Secondary | ICD-10-CM | POA: Diagnosis not present

## 2016-03-01 DIAGNOSIS — K529 Noninfective gastroenteritis and colitis, unspecified: Secondary | ICD-10-CM | POA: Diagnosis not present

## 2016-03-01 DIAGNOSIS — Z4800 Encounter for change or removal of nonsurgical wound dressing: Secondary | ICD-10-CM | POA: Diagnosis not present

## 2016-03-01 DIAGNOSIS — F29 Unspecified psychosis not due to a substance or known physiological condition: Secondary | ICD-10-CM | POA: Diagnosis not present

## 2016-03-01 DIAGNOSIS — D66 Hereditary factor VIII deficiency: Secondary | ICD-10-CM | POA: Diagnosis not present

## 2016-03-01 DIAGNOSIS — K659 Peritonitis, unspecified: Secondary | ICD-10-CM | POA: Diagnosis not present

## 2016-03-07 DIAGNOSIS — D66 Hereditary factor VIII deficiency: Secondary | ICD-10-CM | POA: Diagnosis not present

## 2016-03-07 DIAGNOSIS — K659 Peritonitis, unspecified: Secondary | ICD-10-CM | POA: Diagnosis not present

## 2016-03-07 DIAGNOSIS — F29 Unspecified psychosis not due to a substance or known physiological condition: Secondary | ICD-10-CM | POA: Diagnosis not present

## 2016-03-07 DIAGNOSIS — K529 Noninfective gastroenteritis and colitis, unspecified: Secondary | ICD-10-CM | POA: Diagnosis not present

## 2016-03-07 DIAGNOSIS — Z4800 Encounter for change or removal of nonsurgical wound dressing: Secondary | ICD-10-CM | POA: Diagnosis not present

## 2016-03-23 DIAGNOSIS — Z4800 Encounter for change or removal of nonsurgical wound dressing: Secondary | ICD-10-CM | POA: Diagnosis not present

## 2016-03-23 DIAGNOSIS — D66 Hereditary factor VIII deficiency: Secondary | ICD-10-CM | POA: Diagnosis not present

## 2016-03-23 DIAGNOSIS — K659 Peritonitis, unspecified: Secondary | ICD-10-CM | POA: Diagnosis not present

## 2016-03-23 DIAGNOSIS — K529 Noninfective gastroenteritis and colitis, unspecified: Secondary | ICD-10-CM | POA: Diagnosis not present

## 2016-03-23 DIAGNOSIS — F29 Unspecified psychosis not due to a substance or known physiological condition: Secondary | ICD-10-CM | POA: Diagnosis not present

## 2016-03-29 DIAGNOSIS — K529 Noninfective gastroenteritis and colitis, unspecified: Secondary | ICD-10-CM | POA: Diagnosis not present

## 2016-03-29 DIAGNOSIS — D66 Hereditary factor VIII deficiency: Secondary | ICD-10-CM | POA: Diagnosis not present

## 2016-03-29 DIAGNOSIS — K659 Peritonitis, unspecified: Secondary | ICD-10-CM | POA: Diagnosis not present

## 2016-03-29 DIAGNOSIS — F29 Unspecified psychosis not due to a substance or known physiological condition: Secondary | ICD-10-CM | POA: Diagnosis not present

## 2016-03-29 DIAGNOSIS — Z4800 Encounter for change or removal of nonsurgical wound dressing: Secondary | ICD-10-CM | POA: Diagnosis not present

## 2016-04-06 DIAGNOSIS — Z4800 Encounter for change or removal of nonsurgical wound dressing: Secondary | ICD-10-CM | POA: Diagnosis not present

## 2016-04-06 DIAGNOSIS — D66 Hereditary factor VIII deficiency: Secondary | ICD-10-CM | POA: Diagnosis not present

## 2016-04-06 DIAGNOSIS — F29 Unspecified psychosis not due to a substance or known physiological condition: Secondary | ICD-10-CM | POA: Diagnosis not present

## 2016-04-06 DIAGNOSIS — K529 Noninfective gastroenteritis and colitis, unspecified: Secondary | ICD-10-CM | POA: Diagnosis not present

## 2016-04-06 DIAGNOSIS — K659 Peritonitis, unspecified: Secondary | ICD-10-CM | POA: Diagnosis not present

## 2016-04-12 DIAGNOSIS — Z4800 Encounter for change or removal of nonsurgical wound dressing: Secondary | ICD-10-CM | POA: Diagnosis not present

## 2016-04-12 DIAGNOSIS — F29 Unspecified psychosis not due to a substance or known physiological condition: Secondary | ICD-10-CM | POA: Diagnosis not present

## 2016-04-12 DIAGNOSIS — K659 Peritonitis, unspecified: Secondary | ICD-10-CM | POA: Diagnosis not present

## 2016-04-12 DIAGNOSIS — K529 Noninfective gastroenteritis and colitis, unspecified: Secondary | ICD-10-CM | POA: Diagnosis not present

## 2016-04-12 DIAGNOSIS — D66 Hereditary factor VIII deficiency: Secondary | ICD-10-CM | POA: Diagnosis not present

## 2016-04-19 DIAGNOSIS — K529 Noninfective gastroenteritis and colitis, unspecified: Secondary | ICD-10-CM | POA: Diagnosis not present

## 2016-04-19 DIAGNOSIS — D66 Hereditary factor VIII deficiency: Secondary | ICD-10-CM | POA: Diagnosis not present

## 2016-04-19 DIAGNOSIS — K659 Peritonitis, unspecified: Secondary | ICD-10-CM | POA: Diagnosis not present

## 2016-04-19 DIAGNOSIS — Z4800 Encounter for change or removal of nonsurgical wound dressing: Secondary | ICD-10-CM | POA: Diagnosis not present

## 2016-04-19 DIAGNOSIS — F29 Unspecified psychosis not due to a substance or known physiological condition: Secondary | ICD-10-CM | POA: Diagnosis not present

## 2016-04-19 NOTE — Progress Notes (Deleted)
Subjective:   Darren Torres is a 61 y.o. male who presents for an Initial Medicare Annual Wellness Visit.  Review of Systems  No ROS.  Medicare Wellness Visit.    Sleep patterns: {SX; SLEEP PATTERNS:18802::"feels rested on waking","does not get up to void","gets up *** times nightly to void","sleeps *** hours nightly"}.   Home Safety/Smoke Alarms:   Living environment; residence and Firearm Safety: {Rehab home environment / accessibility:30080::"no firearms","firearms stored safely"}. Seat Belt Safety/Bike Helmet: Wears seat belt.   Counseling:   Eye Exam-  Dental-  Male:   CCS- Last 06/03/07: Normal. No f/u on file.  PSA-  Lab Results  Component Value Date   PSA 1.04 05/30/2015   PSA 0.77 04/28/2012   PSA 0.81 05/28/2011       Objective:    There were no vitals filed for this visit. There is no height or weight on file to calculate BMI.  Current Medications (verified) Outpatient Encounter Prescriptions as of 2/59/2018  Medication Sig  . Antihemophilic Factor, Recomb, (HELIXATE FS) 3000 units KIT every other day.  Marland Kitchen BIOTIN PO Take 1 tablet by mouth daily.  Sarajane Marek Sodium 30-100 MG CAPS Take by mouth as needed.   . citalopram (CELEXA) 40 MG tablet TAKE ONE TABLET BY MOUTH EVERY DAY  . ketoconazole (NIZORAL) 2 % cream APPLY twice WEEKLY FOR one MONTH, THEN AS NEEDED  . morphine (KADIAN) 30 MG 24 hr capsule Take 30 mg by mouth daily.  Marland Kitchen morphine (MSIR) 15 MG tablet Take 15 mg by mouth every 4 (four) hours as needed.  . Multiple Vitamin (MULTIVITAMIN) capsule Take 1 capsule by mouth daily.  . pantoprazole (PROTONIX) 40 MG tablet TAKE ONE TABLET BY MOUTH EVERY DAY  . Zinc Acetate, Oral, (ZINC ACETATE PO) Take 1 tablet by mouth daily.   No facility-administered encounter medications on file as of 04/23/2016.     Allergies (verified) Chantix [varenicline tartrate]; Wellbutrin [bupropion hcl]; Asa arthritis strength-antacid [aspirin buffered]; and Other    History: Past Medical History:  Diagnosis Date  . Chronic kidney disease   . Clotting disorder ([place])   . Depression   . Hemophilia ([place])   . Hepatitis C    Past Surgical History:  Procedure Laterality Date  . JOINT REPLACEMENT     Family History  Problem Relation Age of Onset  . Arthritis Mother   . Heart disease Father   . Heart disease Maternal Grandmother   . Heart disease Maternal Grandfather   . Cancer Paternal Grandmother   . Heart disease Paternal Grandfather    Social History   Occupational History  . Not on file.   Social History Main Topics  . Smoking status: Current Every Day Smoker    Packs/day: 0.50    Types: Cigarettes  . Smokeless tobacco: Not on file  . Alcohol use No  . Drug use: No  . Sexual activity: Not Currently   Tobacco Counseling Ready to quit: Not Answered Counseling given: Not Answered   Activities of Daily Living No flowsheet data found.  Immunizations and Health Maintenance Immunization History  Administered Date(s) Administered  . Zoster 03/19/2010   Health Maintenance Due  Topic Date Due  . HIV Screening  07/11/1972  . TETANUS/TDAP  07/11/1976  . INFLUENZA VACCINE  10/18/2015    Patient Care Team: Darreld Mclean, MD as PCP - General (Family Medicine) Ralene Muskrat. Su Ley, MD as Consulting Physician (Gastroenterology)  Indicate any recent Medical Services you may have received from other than  [facility] providers in the past year (date may be approximate).    Assessment:   This is a routine wellness examination for Darren Torres. Physical assessment deferred to PCP.   Hearing/Vision screen No exam data present  Dietary issues and exercise activities discussed:   Diet (meal preparation, eat out, water intake, caffeinated beverages, dairy products, fruits and vegetables): {Desc; diets:16563} Breakfast: Lunch:  Dinner:      Goals    None     Depression Screen PHQ 2/9 Scores 12/22/2015 11/09/2013  PHQ - 2 Score 0 0     Fall Risk Fall Risk  12/22/2015 11/09/2013  Falls in the past year? No No    Cognitive Function:        Screening Tests Health Maintenance  Topic Date Due  . HIV Screening  07/11/1972  . TETANUS/TDAP  07/11/1976  . INFLUENZA VACCINE  10/18/2015  . COLONOSCOPY  04/01/2024  . Hepatitis C Screening  Completed        Plan:   ***  During the course of the visit Darren Torres was educated and counseled about the following appropriate screening and preventive services:   Vaccines to include Pneumoccal, Influenza, Hepatitis B, Td, Zostavax, HCV  Electrocardiogram  Colorectal cancer screening  Cardiovascular disease screening  Diabetes screening  Glaucoma screening  Nutrition counseling  Prostate cancer screening  Smoking cessation counseling  Patient Instructions (the written plan) were given to the patient.   Darren Torres, [place]   04/19/2016

## 2016-04-19 NOTE — Progress Notes (Deleted)
Pre visit review using our clinic review tool, if applicable. No additional management support is needed unless otherwise documented below in the visit note. 

## 2016-04-23 ENCOUNTER — Ambulatory Visit: Payer: Medicare HMO | Admitting: *Deleted

## 2016-04-26 DIAGNOSIS — K529 Noninfective gastroenteritis and colitis, unspecified: Secondary | ICD-10-CM | POA: Diagnosis not present

## 2016-04-26 DIAGNOSIS — D66 Hereditary factor VIII deficiency: Secondary | ICD-10-CM | POA: Diagnosis not present

## 2016-04-26 DIAGNOSIS — F29 Unspecified psychosis not due to a substance or known physiological condition: Secondary | ICD-10-CM | POA: Diagnosis not present

## 2016-04-26 DIAGNOSIS — Z4800 Encounter for change or removal of nonsurgical wound dressing: Secondary | ICD-10-CM | POA: Diagnosis not present

## 2016-04-26 DIAGNOSIS — K659 Peritonitis, unspecified: Secondary | ICD-10-CM | POA: Diagnosis not present

## 2016-05-02 DIAGNOSIS — F29 Unspecified psychosis not due to a substance or known physiological condition: Secondary | ICD-10-CM | POA: Diagnosis not present

## 2016-05-02 DIAGNOSIS — K529 Noninfective gastroenteritis and colitis, unspecified: Secondary | ICD-10-CM | POA: Diagnosis not present

## 2016-05-02 DIAGNOSIS — D66 Hereditary factor VIII deficiency: Secondary | ICD-10-CM | POA: Diagnosis not present

## 2016-05-02 DIAGNOSIS — K659 Peritonitis, unspecified: Secondary | ICD-10-CM | POA: Diagnosis not present

## 2016-05-02 DIAGNOSIS — Z4800 Encounter for change or removal of nonsurgical wound dressing: Secondary | ICD-10-CM | POA: Diagnosis not present

## 2016-05-10 DIAGNOSIS — Z4800 Encounter for change or removal of nonsurgical wound dressing: Secondary | ICD-10-CM | POA: Diagnosis not present

## 2016-05-10 DIAGNOSIS — K529 Noninfective gastroenteritis and colitis, unspecified: Secondary | ICD-10-CM | POA: Diagnosis not present

## 2016-05-10 DIAGNOSIS — F29 Unspecified psychosis not due to a substance or known physiological condition: Secondary | ICD-10-CM | POA: Diagnosis not present

## 2016-05-10 DIAGNOSIS — K659 Peritonitis, unspecified: Secondary | ICD-10-CM | POA: Diagnosis not present

## 2016-05-10 DIAGNOSIS — D66 Hereditary factor VIII deficiency: Secondary | ICD-10-CM | POA: Diagnosis not present

## 2016-05-15 DIAGNOSIS — L309 Dermatitis, unspecified: Secondary | ICD-10-CM | POA: Diagnosis not present

## 2016-05-17 ENCOUNTER — Other Ambulatory Visit: Payer: Self-pay | Admitting: Emergency Medicine

## 2016-05-17 DIAGNOSIS — K659 Peritonitis, unspecified: Secondary | ICD-10-CM | POA: Diagnosis not present

## 2016-05-17 DIAGNOSIS — K219 Gastro-esophageal reflux disease without esophagitis: Secondary | ICD-10-CM

## 2016-05-17 DIAGNOSIS — D66 Hereditary factor VIII deficiency: Secondary | ICD-10-CM | POA: Diagnosis not present

## 2016-05-17 DIAGNOSIS — F29 Unspecified psychosis not due to a substance or known physiological condition: Secondary | ICD-10-CM | POA: Diagnosis not present

## 2016-05-17 DIAGNOSIS — K529 Noninfective gastroenteritis and colitis, unspecified: Secondary | ICD-10-CM | POA: Diagnosis not present

## 2016-05-17 DIAGNOSIS — Z4800 Encounter for change or removal of nonsurgical wound dressing: Secondary | ICD-10-CM | POA: Diagnosis not present

## 2016-05-17 MED ORDER — PANTOPRAZOLE SODIUM 40 MG PO TBEC: 40.0000 mg | DELAYED_RELEASE_TABLET | Freq: Every day | ORAL | 1 refills | Status: DC

## 2016-06-21 ENCOUNTER — Ambulatory Visit (INDEPENDENT_AMBULATORY_CARE_PROVIDER_SITE_OTHER): Payer: Medicare HMO | Admitting: Family Medicine

## 2016-06-21 VITALS — BP 126/70 | HR 66 | Ht 70.0 in | Wt 151.2 lb

## 2016-06-21 DIAGNOSIS — D66 Hereditary factor VIII deficiency: Secondary | ICD-10-CM

## 2016-06-21 DIAGNOSIS — Z131 Encounter for screening for diabetes mellitus: Secondary | ICD-10-CM

## 2016-06-21 DIAGNOSIS — L308 Other specified dermatitis: Secondary | ICD-10-CM | POA: Diagnosis not present

## 2016-06-21 DIAGNOSIS — Z13220 Encounter for screening for lipoid disorders: Secondary | ICD-10-CM

## 2016-06-21 DIAGNOSIS — Z125 Encounter for screening for malignant neoplasm of prostate: Secondary | ICD-10-CM

## 2016-06-21 DIAGNOSIS — K7460 Unspecified cirrhosis of liver: Secondary | ICD-10-CM

## 2016-06-21 NOTE — Progress Notes (Signed)
[place] at [facility] [address] [place], [place] [address] [phone]  Date:  06/21/2016   Name:  Darren Torres   DOB:  04-16-1955   MRN:  [id number]  PCP:  Lamar Blinks, MD    Chief Complaint: Follow-up (Pt here for 6 month f/u visit. )   History of Present Illness:  Darren Torres is a 61 y.o. very pleasant male patient who presents with the following:  History of hemophilia, cirrhosis, chronic pain ([facility]) Here today for a recheck visit- last seen by myself in October  He is seeing dermatology for itchy rash on his scalp- they are treating him with a triamcinolone cream which has helped a lot.    He is now using this just as needed He also sees hematology through [facility]- his hemophilia is stable, he had his shot yesterday His Hep C is now cured- he is a pt of Dr. Kenton Torres with [facility].  He is not sure if he needs to see them again soon- generally they had seen him twice a year.  He will call them and inquire about a visit   He has not eaten yet today but did have come coca cola  Here today with his wife Darren Torres- they brought me some fresh eggs from their farm which is much appreciated.    He is overall feeling well and does not have any particular concerns today  BP Readings from Last 3 Encounters:  06/21/16 126/70  12/22/15 128/60  08/25/15 124/72   Wt Readings from Last 3 Encounters:  06/21/16 151 lb 3.2 oz (68.6 kg)  08/25/15 150 lb (68 kg)  08/10/15 147 lb (66.7 kg)      Patient Active Problem List   Diagnosis Date Noted  . Hepatic cirrhosis ([place]) 05/30/2015  . Cataract 04/28/2012  . DJD (degenerative joint disease) 04/28/2012  . Abnormality of gait 04/28/2012  . Hemophilia ([place]) 05/28/2011  . Hepatitis C 05/28/2011  . Chronic pain 05/28/2011  . Depression 05/28/2011    Past Medical History:  Diagnosis Date  . Chronic kidney disease   . Clotting disorder ([place])   . Depression   . Hemophilia ([place])    . Hepatitis C     Past Surgical History:  Procedure Laterality Date  . JOINT REPLACEMENT      Social History  Substance Use Topics  . Smoking status: Current Every Day Smoker    Packs/day: 0.50    Types: Cigarettes  . Smokeless tobacco: Not on file  . Alcohol use No    Family History  Problem Relation Age of Onset  . Arthritis Mother   . Heart disease Father   . Heart disease Maternal Grandmother   . Heart disease Maternal Grandfather   . Cancer Paternal Grandmother   . Heart disease Paternal Grandfather     Allergies  Allergen Reactions  . Chantix [Varenicline Tartrate] Other (See Comments)    Changes mood  . Wellbutrin [Bupropion Hcl] Palpitations  . Asa Arthritis Strength-Antacid [Aspirin Buffered] Other (See Comments)    Due to blood thinner  . Other     cryopercipate    Medication list has been reviewed and updated.  Current Outpatient Prescriptions on File Prior to Visit  Medication Sig Dispense Refill  . Antihemophilic Factor, Recomb, (HELIXATE FS) 3000 units KIT every other day.    Marland Kitchen BIOTIN PO Take 1 tablet by mouth daily.    Sarajane Marek Sodium 30-100 MG CAPS Take  by mouth as needed.     . citalopram (CELEXA) 40 MG tablet TAKE ONE TABLET BY MOUTH EVERY DAY 90 tablet 1  . ketoconazole (NIZORAL) 2 % cream APPLY twice WEEKLY FOR one MONTH, THEN AS NEEDED 60 g 1  . morphine (KADIAN) 30 MG 24 hr capsule Take 30 mg by mouth daily.    Marland Kitchen morphine (MSIR) 15 MG tablet Take 15 mg by mouth every 4 (four) hours as needed.    . Multiple Vitamin (MULTIVITAMIN) capsule Take 1 capsule by mouth daily.    . pantoprazole (PROTONIX) 40 MG tablet Take 1 tablet (40 mg total) by mouth daily. 90 tablet 1  . Zinc Acetate, Oral, (ZINC ACETATE PO) Take 1 tablet by mouth daily.     No current facility-administered medications on file prior to visit.     Review of Systems:  As per HPI- otherwise negative.   Physical Examination: Vitals:   06/21/16 1413  BP:  126/70  Pulse: 66   Vitals:   06/21/16 1413  Weight: 151 lb 3.2 oz (68.6 kg)  Height: _0  (1.778 m)   Body mass index is 21.69 kg/m. Ideal Body Weight: Weight in (lb) to have BMI = 25: 173.9  GEN: WDWN, NAD, Non-toxic, A & O x 3, pale, appears his normal self  HEENT: Atraumatic, Normocephalic. Neck supple. No masses, No LAD. Ears and Nose: No external deformity. CV: RRR, No M/G/R. No JVD. No thrill. No extra heart sounds. PULM: CTA B, no wheezes, crackles, rhonchi. No retractions. No resp. distress. No accessory muscle use. ABD: S, NT, ND EXTR: No c/c/e NEURO Normal gait.  PSYCH: Normally interactive. Conversant. Not depressed or anxious appearing.  Calm demeanor.    Assessment and Plan: Spongiotic dermatitis  Cirrhosis of liver without ascites, unspecified hepatic cirrhosis type ([place])  Hemophilia ([place])  Screening for hyperlipidemia - Plan: Lipid panel  Screening for prostate cancer - Plan: PSA  Screening for diabetes mellitus - Plan: Hemoglobin A1c  Here today for a follow-up visit Labs pending as above- will follow-up with him with results Asked him to contact his hepatologist about his next visit  Signed Lamar Blinks, MD

## 2016-06-21 NOTE — Progress Notes (Signed)
Pre visit review using our clinic review tool, if applicable. No additional management support is needed unless otherwise documented below in the visit note. 

## 2016-06-21 NOTE — Patient Instructions (Addendum)
Please give Dr. Tiburcio Torres' office a call and inquire about any appt that you may need to schedule  Darren Torres, Darren Etienne, MD  [address]  [place], [place] [address]  Phone: [phone]  Fax: [phone]  Also, please ask your hematologist about a tetanus shot.  I can give you one at your next visit as long as it is safe for you

## 2016-06-22 LAB — LIPID PANEL
CHOL/HDL RATIO: 5
Cholesterol: 223 mg/dL — ABNORMAL HIGH (ref 0–200)
HDL: 47 mg/dL (ref >39.00)
LDL CALC: 154 mg/dL — AB (ref 0–99)
NONHDL: 175.98
Triglycerides: 109 mg/dL (ref 0.0–149.0)
VLDL: 21.8 mg/dL (ref 0.0–40.0)

## 2016-06-22 LAB — PSA: PSA: 0.52 ng/mL (ref 0.10–4.00)

## 2016-06-22 LAB — HEMOGLOBIN A1C: Hgb A1c MFr Bld: 5.6 % (ref 4.6–6.5)

## 2016-06-23 ENCOUNTER — Encounter: Payer: Self-pay | Admitting: Family Medicine

## 2016-08-14 DIAGNOSIS — R21 Rash and other nonspecific skin eruption: Secondary | ICD-10-CM | POA: Diagnosis not present

## 2016-08-14 DIAGNOSIS — B358 Other dermatophytoses: Secondary | ICD-10-CM | POA: Diagnosis not present

## 2016-08-14 DIAGNOSIS — Z872 Personal history of diseases of the skin and subcutaneous tissue: Secondary | ICD-10-CM | POA: Diagnosis not present

## 2016-08-15 ENCOUNTER — Other Ambulatory Visit: Payer: Self-pay | Admitting: Family Medicine

## 2016-08-15 DIAGNOSIS — L308 Other specified dermatitis: Secondary | ICD-10-CM

## 2016-08-22 DIAGNOSIS — D66 Hereditary factor VIII deficiency: Secondary | ICD-10-CM | POA: Diagnosis not present

## 2016-08-22 DIAGNOSIS — B358 Other dermatophytoses: Secondary | ICD-10-CM | POA: Diagnosis not present

## 2016-08-22 DIAGNOSIS — L308 Other specified dermatitis: Secondary | ICD-10-CM | POA: Diagnosis not present

## 2016-09-24 ENCOUNTER — Other Ambulatory Visit: Payer: Self-pay | Admitting: Family Medicine

## 2016-09-24 DIAGNOSIS — K219 Gastro-esophageal reflux disease without esophagitis: Secondary | ICD-10-CM

## 2016-09-24 DIAGNOSIS — F329 Major depressive disorder, single episode, unspecified: Secondary | ICD-10-CM

## 2016-09-24 DIAGNOSIS — F32A Depression, unspecified: Secondary | ICD-10-CM

## 2016-09-27 ENCOUNTER — Other Ambulatory Visit: Payer: Self-pay | Admitting: Family Medicine

## 2016-10-08 ENCOUNTER — Other Ambulatory Visit: Payer: Self-pay | Admitting: Family Medicine

## 2016-10-10 ENCOUNTER — Other Ambulatory Visit: Payer: Self-pay | Admitting: *Deleted

## 2016-10-10 NOTE — Patient Outreach (Signed)
HIGH RISK [facility] PATIENT OUTREACH. I called Mr. Darren Torres but did not reach him. I left a voice mail and requested a return call.  I have reviewed his chart and see he has a diagnosis of SEVERE HEMOPHILIA "A" which [facility]. His medication is very expensive for this also. It appears local retail cost for one dose is $400-425/per dose which he needs every 3 days.  I will call him again tomorrow.  Zara Councilarroll C. Burgess EstelleSpinks, MSN, [facility]NP-BC Gerontological Nurse Practitioner [facility] Care Management [phone]

## 2016-10-12 ENCOUNTER — Other Ambulatory Visit: Payer: Self-pay | Admitting: *Deleted

## 2016-10-12 NOTE — Patient Outreach (Signed)
[employer] High Risk Screen #2. I was not able to reach Mr. Darren Torres today but left a message and requested a return call. If I don't hear back from him, I will reach out again on Monday, July 30th.  Zara Councilarroll C. Burgess EstelleSpinks, MSN, [facility]NP-BC Gerontological Nurse Practitioner [facility] Care Management [phone]

## 2016-10-15 ENCOUNTER — Other Ambulatory Visit: Payer: Self-pay | Admitting: *Deleted

## 2016-10-15 ENCOUNTER — Encounter: Payer: Self-pay | Admitting: *Deleted

## 2016-10-15 NOTE — Patient Outreach (Signed)
[employer] High Risk screen. I have called Mr. Jacqualine CodeBoarman on 3 occasions and reached his voice mail, left a message and requested a return call. I will now send him an unsussessful out reach letter and he will be able to contact us if he feels these services would be of benefit to him.  Zara Councilarroll C. Burgess EstelleSpinks, MSN, [facility] LLCGNP-BC Gerontological Nurse Practitioner [facility] Care Management [phone]

## 2016-10-16 DIAGNOSIS — Z08 Encounter for follow-up examination after completed treatment for malignant neoplasm: Secondary | ICD-10-CM | POA: Diagnosis not present

## 2016-10-16 DIAGNOSIS — B358 Other dermatophytoses: Secondary | ICD-10-CM | POA: Diagnosis not present

## 2016-10-16 DIAGNOSIS — B350 Tinea barbae and tinea capitis: Secondary | ICD-10-CM | POA: Diagnosis not present

## 2017-01-16 DIAGNOSIS — M362 Hemophilic arthropathy: Secondary | ICD-10-CM | POA: Diagnosis not present

## 2017-01-16 DIAGNOSIS — B1710 Acute hepatitis C without hepatic coma: Secondary | ICD-10-CM | POA: Diagnosis not present

## 2017-01-16 DIAGNOSIS — G8929 Other chronic pain: Secondary | ICD-10-CM | POA: Diagnosis not present

## 2017-01-16 DIAGNOSIS — D759 Disease of blood and blood-forming organs, unspecified: Secondary | ICD-10-CM | POA: Diagnosis not present

## 2017-01-16 DIAGNOSIS — M363 Arthropathy in other blood disorders: Secondary | ICD-10-CM | POA: Diagnosis not present

## 2017-01-16 DIAGNOSIS — M25571 Pain in right ankle and joints of right foot: Secondary | ICD-10-CM | POA: Diagnosis not present

## 2017-01-16 DIAGNOSIS — R21 Rash and other nonspecific skin eruption: Secondary | ICD-10-CM | POA: Diagnosis not present

## 2017-01-16 DIAGNOSIS — D66 Hereditary factor VIII deficiency: Secondary | ICD-10-CM | POA: Diagnosis not present

## 2017-01-21 ENCOUNTER — Other Ambulatory Visit: Payer: Self-pay | Admitting: Family Medicine

## 2017-01-21 DIAGNOSIS — F329 Major depressive disorder, single episode, unspecified: Secondary | ICD-10-CM

## 2017-01-21 DIAGNOSIS — F32A Depression, unspecified: Secondary | ICD-10-CM

## 2017-01-21 DIAGNOSIS — K219 Gastro-esophageal reflux disease without esophagitis: Secondary | ICD-10-CM

## 2017-02-01 ENCOUNTER — Encounter: Payer: Self-pay | Admitting: *Deleted

## 2017-02-01 ENCOUNTER — Other Ambulatory Visit: Payer: Self-pay | Admitting: *Deleted

## 2017-02-01 NOTE — Patient Outreach (Signed)
[facility] Care Management  02/01/2017  Darren MomentWilliam Torres 09-Sep-1955 [id number]  Received referral via Health Plan from High Cost Claimant :  Telephone call to patient who advised [facility] care management services.  Patient advised that he does not have health care concerns & does not need [facility].  Declines contact information.   Plan: Send MD closure letter. Send to care management assistant to close out.  Darren CanLinda Angla Delahunt, RN BSN CCM Care Management Coordinator [facility] Care Management  [phone]

## 2017-02-15 ENCOUNTER — Other Ambulatory Visit: Payer: Self-pay | Admitting: Family Medicine

## 2017-02-15 DIAGNOSIS — L308 Other specified dermatitis: Secondary | ICD-10-CM

## 2017-02-19 DIAGNOSIS — D759 Disease of blood and blood-forming organs, unspecified: Secondary | ICD-10-CM | POA: Diagnosis not present

## 2017-02-19 DIAGNOSIS — G8929 Other chronic pain: Secondary | ICD-10-CM | POA: Diagnosis not present

## 2017-02-19 DIAGNOSIS — M362 Hemophilic arthropathy: Secondary | ICD-10-CM | POA: Diagnosis not present

## 2017-02-19 DIAGNOSIS — M25571 Pain in right ankle and joints of right foot: Secondary | ICD-10-CM | POA: Diagnosis not present

## 2017-02-19 DIAGNOSIS — M19071 Primary osteoarthritis, right ankle and foot: Secondary | ICD-10-CM | POA: Diagnosis not present

## 2017-02-19 DIAGNOSIS — M21861 Other specified acquired deformities of right lower leg: Secondary | ICD-10-CM | POA: Diagnosis not present

## 2017-02-19 DIAGNOSIS — M363 Arthropathy in other blood disorders: Secondary | ICD-10-CM | POA: Diagnosis not present

## 2017-02-20 DIAGNOSIS — D66 Hereditary factor VIII deficiency: Secondary | ICD-10-CM | POA: Diagnosis not present

## 2017-03-01 DIAGNOSIS — M21861 Other specified acquired deformities of right lower leg: Secondary | ICD-10-CM | POA: Diagnosis not present

## 2017-03-01 DIAGNOSIS — M363 Arthropathy in other blood disorders: Secondary | ICD-10-CM | POA: Diagnosis not present

## 2017-03-01 DIAGNOSIS — D66 Hereditary factor VIII deficiency: Secondary | ICD-10-CM | POA: Diagnosis not present

## 2017-03-01 DIAGNOSIS — M1990 Unspecified osteoarthritis, unspecified site: Secondary | ICD-10-CM | POA: Diagnosis not present

## 2017-03-01 DIAGNOSIS — M362 Hemophilic arthropathy: Secondary | ICD-10-CM | POA: Diagnosis not present

## 2017-03-01 DIAGNOSIS — M19071 Primary osteoarthritis, right ankle and foot: Secondary | ICD-10-CM | POA: Diagnosis not present

## 2017-03-01 DIAGNOSIS — K219 Gastro-esophageal reflux disease without esophagitis: Secondary | ICD-10-CM | POA: Diagnosis not present

## 2017-03-01 DIAGNOSIS — D759 Disease of blood and blood-forming organs, unspecified: Secondary | ICD-10-CM | POA: Diagnosis not present

## 2017-03-01 DIAGNOSIS — K7460 Unspecified cirrhosis of liver: Secondary | ICD-10-CM | POA: Diagnosis not present

## 2017-03-08 DIAGNOSIS — M19071 Primary osteoarthritis, right ankle and foot: Secondary | ICD-10-CM | POA: Diagnosis not present

## 2017-03-08 DIAGNOSIS — M362 Hemophilic arthropathy: Secondary | ICD-10-CM | POA: Diagnosis not present

## 2017-03-08 DIAGNOSIS — M363 Arthropathy in other blood disorders: Secondary | ICD-10-CM | POA: Diagnosis not present

## 2017-03-08 DIAGNOSIS — K7460 Unspecified cirrhosis of liver: Secondary | ICD-10-CM | POA: Diagnosis not present

## 2017-03-08 DIAGNOSIS — K219 Gastro-esophageal reflux disease without esophagitis: Secondary | ICD-10-CM | POA: Diagnosis not present

## 2017-03-08 DIAGNOSIS — G8918 Other acute postprocedural pain: Secondary | ICD-10-CM | POA: Diagnosis not present

## 2017-03-08 DIAGNOSIS — D759 Disease of blood and blood-forming organs, unspecified: Secondary | ICD-10-CM | POA: Diagnosis not present

## 2017-03-08 DIAGNOSIS — D66 Hereditary factor VIII deficiency: Secondary | ICD-10-CM | POA: Diagnosis not present

## 2017-03-08 DIAGNOSIS — M21861 Other specified acquired deformities of right lower leg: Secondary | ICD-10-CM | POA: Diagnosis not present

## 2017-03-08 DIAGNOSIS — Z981 Arthrodesis status: Secondary | ICD-10-CM | POA: Diagnosis not present

## 2017-03-08 DIAGNOSIS — M1990 Unspecified osteoarthritis, unspecified site: Secondary | ICD-10-CM | POA: Diagnosis not present

## 2017-03-09 DIAGNOSIS — M362 Hemophilic arthropathy: Secondary | ICD-10-CM | POA: Diagnosis not present

## 2017-03-09 DIAGNOSIS — K7460 Unspecified cirrhosis of liver: Secondary | ICD-10-CM | POA: Diagnosis not present

## 2017-03-09 DIAGNOSIS — D66 Hereditary factor VIII deficiency: Secondary | ICD-10-CM | POA: Diagnosis not present

## 2017-03-09 DIAGNOSIS — D759 Disease of blood and blood-forming organs, unspecified: Secondary | ICD-10-CM | POA: Diagnosis not present

## 2017-03-09 DIAGNOSIS — M21861 Other specified acquired deformities of right lower leg: Secondary | ICD-10-CM | POA: Diagnosis not present

## 2017-03-09 DIAGNOSIS — M1990 Unspecified osteoarthritis, unspecified site: Secondary | ICD-10-CM | POA: Diagnosis not present

## 2017-03-09 DIAGNOSIS — K219 Gastro-esophageal reflux disease without esophagitis: Secondary | ICD-10-CM | POA: Diagnosis not present

## 2017-03-09 DIAGNOSIS — M19071 Primary osteoarthritis, right ankle and foot: Secondary | ICD-10-CM | POA: Diagnosis not present

## 2017-03-09 DIAGNOSIS — M363 Arthropathy in other blood disorders: Secondary | ICD-10-CM | POA: Diagnosis not present

## 2017-03-21 DIAGNOSIS — D66 Hereditary factor VIII deficiency: Secondary | ICD-10-CM | POA: Diagnosis not present

## 2017-03-21 DIAGNOSIS — K529 Noninfective gastroenteritis and colitis, unspecified: Secondary | ICD-10-CM | POA: Diagnosis not present

## 2017-03-21 DIAGNOSIS — Z4800 Encounter for change or removal of nonsurgical wound dressing: Secondary | ICD-10-CM | POA: Diagnosis not present

## 2017-03-21 DIAGNOSIS — K659 Peritonitis, unspecified: Secondary | ICD-10-CM | POA: Diagnosis not present

## 2017-03-21 DIAGNOSIS — F29 Unspecified psychosis not due to a substance or known physiological condition: Secondary | ICD-10-CM | POA: Diagnosis not present

## 2017-03-22 DIAGNOSIS — Z981 Arthrodesis status: Secondary | ICD-10-CM | POA: Diagnosis not present

## 2017-03-22 DIAGNOSIS — Z4789 Encounter for other orthopedic aftercare: Secondary | ICD-10-CM | POA: Diagnosis not present

## 2017-03-26 ENCOUNTER — Other Ambulatory Visit: Payer: Self-pay | Admitting: Family Medicine

## 2017-04-10 ENCOUNTER — Other Ambulatory Visit: Payer: Self-pay | Admitting: Family Medicine

## 2017-04-10 DIAGNOSIS — F32A Depression, unspecified: Secondary | ICD-10-CM

## 2017-04-10 DIAGNOSIS — F329 Major depressive disorder, single episode, unspecified: Secondary | ICD-10-CM

## 2017-04-23 DIAGNOSIS — M19071 Primary osteoarthritis, right ankle and foot: Secondary | ICD-10-CM | POA: Diagnosis not present

## 2017-04-23 DIAGNOSIS — M25571 Pain in right ankle and joints of right foot: Secondary | ICD-10-CM | POA: Diagnosis not present

## 2017-04-23 DIAGNOSIS — Z981 Arthrodesis status: Secondary | ICD-10-CM | POA: Diagnosis not present

## 2017-05-02 ENCOUNTER — Other Ambulatory Visit: Payer: Self-pay | Admitting: Family Medicine

## 2017-05-02 DIAGNOSIS — K219 Gastro-esophageal reflux disease without esophagitis: Secondary | ICD-10-CM

## 2017-05-02 DIAGNOSIS — L308 Other specified dermatitis: Secondary | ICD-10-CM

## 2017-05-27 ENCOUNTER — Other Ambulatory Visit: Payer: Self-pay | Admitting: Family Medicine

## 2017-05-27 DIAGNOSIS — F32A Depression, unspecified: Secondary | ICD-10-CM

## 2017-05-27 DIAGNOSIS — F329 Major depressive disorder, single episode, unspecified: Secondary | ICD-10-CM

## 2017-05-30 DIAGNOSIS — M19071 Primary osteoarthritis, right ankle and foot: Secondary | ICD-10-CM | POA: Diagnosis not present

## 2017-05-30 DIAGNOSIS — Z981 Arthrodesis status: Secondary | ICD-10-CM | POA: Diagnosis not present

## 2017-05-30 DIAGNOSIS — M24071 Loose body in right ankle: Secondary | ICD-10-CM | POA: Diagnosis not present

## 2017-05-30 DIAGNOSIS — G8929 Other chronic pain: Secondary | ICD-10-CM | POA: Diagnosis not present

## 2017-05-30 DIAGNOSIS — M25571 Pain in right ankle and joints of right foot: Secondary | ICD-10-CM | POA: Diagnosis not present

## 2017-05-30 DIAGNOSIS — M8580 Other specified disorders of bone density and structure, unspecified site: Secondary | ICD-10-CM | POA: Diagnosis not present

## 2017-07-04 ENCOUNTER — Other Ambulatory Visit: Payer: Self-pay | Admitting: Family Medicine

## 2017-07-04 DIAGNOSIS — L308 Other specified dermatitis: Secondary | ICD-10-CM

## 2017-07-30 DIAGNOSIS — T85848A Pain due to other internal prosthetic devices, implants and grafts, initial encounter: Secondary | ICD-10-CM | POA: Diagnosis not present

## 2017-07-30 DIAGNOSIS — M21861 Other specified acquired deformities of right lower leg: Secondary | ICD-10-CM | POA: Diagnosis not present

## 2017-07-30 DIAGNOSIS — Z981 Arthrodesis status: Secondary | ICD-10-CM | POA: Diagnosis not present

## 2017-07-30 DIAGNOSIS — G8929 Other chronic pain: Secondary | ICD-10-CM | POA: Diagnosis not present

## 2017-07-30 DIAGNOSIS — M19071 Primary osteoarthritis, right ankle and foot: Secondary | ICD-10-CM | POA: Diagnosis not present

## 2017-07-30 DIAGNOSIS — M25571 Pain in right ankle and joints of right foot: Secondary | ICD-10-CM | POA: Diagnosis not present

## 2017-08-06 DIAGNOSIS — B1920 Unspecified viral hepatitis C without hepatic coma: Secondary | ICD-10-CM | POA: Diagnosis not present

## 2017-08-06 DIAGNOSIS — D649 Anemia, unspecified: Secondary | ICD-10-CM | POA: Diagnosis not present

## 2017-08-06 DIAGNOSIS — K219 Gastro-esophageal reflux disease without esophagitis: Secondary | ICD-10-CM | POA: Diagnosis not present

## 2017-08-06 DIAGNOSIS — Z91018 Allergy to other foods: Secondary | ICD-10-CM | POA: Diagnosis not present

## 2017-08-06 DIAGNOSIS — G4733 Obstructive sleep apnea (adult) (pediatric): Secondary | ICD-10-CM | POA: Diagnosis not present

## 2017-08-06 DIAGNOSIS — M19071 Primary osteoarthritis, right ankle and foot: Secondary | ICD-10-CM | POA: Diagnosis not present

## 2017-08-06 DIAGNOSIS — D66 Hereditary factor VIII deficiency: Secondary | ICD-10-CM | POA: Diagnosis not present

## 2017-08-06 DIAGNOSIS — M362 Hemophilic arthropathy: Secondary | ICD-10-CM | POA: Diagnosis not present

## 2017-08-06 DIAGNOSIS — T85848A Pain due to other internal prosthetic devices, implants and grafts, initial encounter: Secondary | ICD-10-CM | POA: Diagnosis not present

## 2017-08-14 DIAGNOSIS — D649 Anemia, unspecified: Secondary | ICD-10-CM | POA: Diagnosis not present

## 2017-08-14 DIAGNOSIS — T85848A Pain due to other internal prosthetic devices, implants and grafts, initial encounter: Secondary | ICD-10-CM | POA: Diagnosis not present

## 2017-08-14 DIAGNOSIS — K219 Gastro-esophageal reflux disease without esophagitis: Secondary | ICD-10-CM | POA: Diagnosis not present

## 2017-08-14 DIAGNOSIS — Z91018 Allergy to other foods: Secondary | ICD-10-CM | POA: Diagnosis not present

## 2017-08-14 DIAGNOSIS — B1920 Unspecified viral hepatitis C without hepatic coma: Secondary | ICD-10-CM | POA: Diagnosis not present

## 2017-08-14 DIAGNOSIS — M362 Hemophilic arthropathy: Secondary | ICD-10-CM | POA: Diagnosis not present

## 2017-08-14 DIAGNOSIS — D66 Hereditary factor VIII deficiency: Secondary | ICD-10-CM | POA: Diagnosis not present

## 2017-08-14 DIAGNOSIS — M19071 Primary osteoarthritis, right ankle and foot: Secondary | ICD-10-CM | POA: Diagnosis not present

## 2017-08-14 DIAGNOSIS — G4733 Obstructive sleep apnea (adult) (pediatric): Secondary | ICD-10-CM | POA: Diagnosis not present

## 2017-09-03 DIAGNOSIS — M19071 Primary osteoarthritis, right ankle and foot: Secondary | ICD-10-CM | POA: Diagnosis not present

## 2017-09-03 DIAGNOSIS — M21861 Other specified acquired deformities of right lower leg: Secondary | ICD-10-CM | POA: Diagnosis not present

## 2017-09-11 ENCOUNTER — Other Ambulatory Visit: Payer: Self-pay | Admitting: Family Medicine

## 2017-09-11 DIAGNOSIS — F32A Depression, unspecified: Secondary | ICD-10-CM

## 2017-09-11 DIAGNOSIS — F329 Major depressive disorder, single episode, unspecified: Secondary | ICD-10-CM

## 2017-09-23 ENCOUNTER — Other Ambulatory Visit: Payer: Self-pay | Admitting: Family Medicine

## 2017-09-26 DIAGNOSIS — M21861 Other specified acquired deformities of right lower leg: Secondary | ICD-10-CM | POA: Diagnosis not present

## 2017-09-26 DIAGNOSIS — Z9889 Other specified postprocedural states: Secondary | ICD-10-CM | POA: Diagnosis not present

## 2017-09-26 DIAGNOSIS — M19071 Primary osteoarthritis, right ankle and foot: Secondary | ICD-10-CM | POA: Diagnosis not present

## 2017-10-02 ENCOUNTER — Other Ambulatory Visit: Payer: Self-pay | Admitting: Family Medicine

## 2017-10-02 DIAGNOSIS — K219 Gastro-esophageal reflux disease without esophagitis: Secondary | ICD-10-CM

## 2017-12-10 ENCOUNTER — Other Ambulatory Visit: Payer: Self-pay | Admitting: Family Medicine

## 2017-12-10 DIAGNOSIS — K219 Gastro-esophageal reflux disease without esophagitis: Secondary | ICD-10-CM

## 2017-12-20 ENCOUNTER — Other Ambulatory Visit: Payer: Self-pay | Admitting: Family Medicine

## 2017-12-20 DIAGNOSIS — F32A Depression, unspecified: Secondary | ICD-10-CM

## 2017-12-20 DIAGNOSIS — F329 Major depressive disorder, single episode, unspecified: Secondary | ICD-10-CM

## 2017-12-20 MED ORDER — CITALOPRAM HYDROBROMIDE 40 MG PO TABS: 40.0000 mg | ORAL_TABLET | Freq: Every day | ORAL | 0 refills | Status: DC

## 2017-12-20 NOTE — Telephone Encounter (Signed)
Copied from CRM [id number]. Topic: Quick Communication - See Telephone Encounter >> Dec 20, 2017  9:43 AM Mare Loan F wrote: Pt is needing a refill on citalopram he has an appt with Dr. Patsy Lager on 12/30/17  [address]   Best number [phone]

## 2017-12-20 NOTE — Telephone Encounter (Signed)
Pt has scheduled appointment on 12/30/17. A courtesy refill of 10 pills given per protocol.  Requested Prescriptions  Pending Prescriptions Disp Refills  . citalopram (CELEXA) 40 MG tablet 10 tablet 0    Sig: Take 1 tablet (40 mg total) by mouth daily.     Psychiatry:  Antidepressants - SSRI Failed - 12/20/2017 10:16 AM      Failed - Completed PHQ-2 or PHQ-9 in the last 360 days.      Failed - Valid encounter within last 6 months    Recent Outpatient Visits          1 year ago Spongiotic dermatitis   Holiday representative at [facility] [place], Gwenlyn Found, MD   1 year ago Spongiotic dermatitis   Holiday representative at [employer] [place], Gwenlyn Found, MD   2 years ago Rash and nonspecific skin eruption   Holiday representative at [employer], Gwenlyn Found, MD   2 years ago Folliculitis   Holiday representative at [employer], Gwenlyn Found, MD   2 years ago Physical exam   Holiday representative at [facility] [place], Gwenlyn Found, MD      Future Appointments            In 1 week Cop[place], Gwenlyn Found, MD [facility] at [facility], [facility]

## 2017-12-28 NOTE — Progress Notes (Addendum)
[place] at [facility] [address] [place], [place] [address] [phone]  Date:  12/30/2017   Name:  Darren Torres   DOB:  February 14, 1956   MRN:  [id number]  PCP:  Darreld Mclean, MD    Chief Complaint: Dermatitis (6 month follow up)   History of Present Illness:  Darren Torres is a 62 y.o. very pleasant male patient who presents with the following:  Routine periodic check up today History cirrhosis, hemophilia, chronic pain and depression His pain is managed by his hematology clinic at this time  Last seen by myself in April of 2018: He is seeing dermatology for itchy rash on his scalp- they are treating him with a triamcinolone cream which has helped a lot.    He is now using this just as needed He also sees hematology through [facility]- his hemophilia is stable, he had his shot yesterday His Hep C is now cured- he is a pt of Dr. Kenton Kingfisher with [facility].  He is not sure if he needs to see them again soon- generally they had seen him twice a year.  He will call them and inquire about a visit   I can see several phone notes from peds hematolgoy but not a recent OV. However they are checking his labs  Liver tests in May were ok, CBC was ok as well.  Hg 13.5, hct 40  He ran out of celexa for about a week and had some withdrawal sx   He did get right ankle fixation earlier this year  He declines flu and tetanus vaccine today  He is NOT fasting today  Current rx:  celexa Morphine PPI  NCCSR:  12/12/2017 1 12/12/2017 Morphine Sulf Er 15 Mg Tablet  30.00 30 La [place]mb 18225 Ran (2345) 0/0 15.00 MME Medicare [place] 12/12/2017 1 12/12/2017 Morphine Sulf Er 30 Mg Tablet  60.00 30 [place]mb 18226 Ran (2345) 0/0 60.00 MME Medicare [place] 11/12/2017 1 11/12/2017 Morphine Sulf Er 15 Mg Tablet  30.00 30 La Imb 17645 Ran (2345) 0/0 15.00 MME Medicare [place] 11/12/2017 1 11/12/2017 Morphine Sulf Er 30 Mg Tablet  60.00 30 La Imb 17644 Ran (2345) 0/0 60.00  MME Medicare [place] 10/10/2017 1 10/10/2017 Morphine Sulf Er 30 Mg Tablet  60.00 30 La Imb 17079 Ran (2345) 0/0 60.00 MME Medicare [place] 09/06/2017 1 09/06/2017 Morphine Sulf Er 30 Mg Tablet  60.00 30 An Far 16471 Ran (2345) 0/0 60.00 MME Medicare [place] 08/14/2017 1 08/14/2017 Hydrocodone-Acetamin 5-325 Mg  40.00 6 Ma Duf 16007 Ran (2345) 0/0 33.33 MME Medicare [place] 07/10/2017 1 07/04/2017 Morphine Sulf Er 30 Mg Tablet  60.00 30 An Far 15225 Ran (2345) 0/0 60.00 MME Medicare [place] 06/08/2017 1 06/07/2017 Morphine Sulf Er 30 Mg Tablet  60.00 30 An Far 14717 Ran (2345) 0/0  He has been a smoker for 40 + years, 1/2 PPD now but he did 1.5 ppd for some time - he has a 30+ pack year history so he does qualify for screening CT.  He would like to have this done, will order for him today   He did have an [place] of his abdominal aorta in 2015 so this does not need to be redone  He expresses some frustration with his current pain medication provider. He wonders if I will write this for him.  Encouraged him to work through his issues with current provider if he can but if this proves ineffective  I can help him Patient Active Problem List   Diagnosis Date Noted  . Hepatic cirrhosis ([place]) 05/30/2015  . Cataract 04/28/2012  . DJD (degenerative joint disease) 04/28/2012  . Abnormality of gait 04/28/2012  . Hemophilia ([place]) 05/28/2011  . Hepatitis C 05/28/2011  . Chronic pain 05/28/2011  . Depression 05/28/2011    Past Medical History:  Diagnosis Date  . Chronic kidney disease   . Clotting disorder ([place])   . Depression   . Hemophilia ([place])   . Hepatitis C     Past Surgical History:  Procedure Laterality Date  . JOINT REPLACEMENT      Social History   Tobacco Use  . Smoking status: Current Every Day Smoker    Packs/day: 0.50    Types: Cigarettes  Substance Use Topics  . Alcohol use: No  . Drug use: No    Family History  Problem Relation Age of Onset  . Arthritis Mother   . Heart disease Father   .  Heart disease Maternal Grandmother   . Heart disease Maternal Grandfather   . Cancer Paternal Grandmother   . Heart disease Paternal Grandfather     Allergies  Allergen Reactions  . Chantix [Varenicline Tartrate] Other (See Comments)    Changes mood  . Nsaids Swelling and Other (See Comments)    Also vomit Due to blood thinner  . Wellbutrin [Bupropion Hcl] Palpitations  . Asa Arthritis Strength-Antacid [Aspirin Buffered] Other (See Comments)    Due to blood thinner  . Other     cryopercipate    Medication list has been reviewed and updated.  Current Outpatient Medications on File Prior to Visit  Medication Sig Dispense Refill  . Antihemophilic Factor, Recomb, (HELIXATE FS) 3000 units KIT every other day.    Marland Kitchen BIOTIN PO Take 1 tablet by mouth daily.    Sarajane Marek Sodium 30-100 MG CAPS Take by mouth as needed.     . citalopram (CELEXA) 40 MG tablet Take 1 tablet (40 mg total) by mouth daily. 10 tablet 0  . morphine (KADIAN) 30 MG 24 hr capsule Take 30 mg by mouth daily.    Marland Kitchen morphine (MSIR) 15 MG tablet Take 15 mg by mouth every 4 (four) hours as needed.    . Multiple Vitamin (MULTIVITAMIN) capsule Take 1 capsule by mouth daily.    . pantoprazole (PROTONIX) 40 MG tablet Take 1 tablet (40 mg total) by mouth daily. 30 tablet 1   No current facility-administered medications on file prior to visit.     Review of Systems:  As per HPI- otherwise negative.   Physical Examination: Vitals:   12/30/17 1305  BP: 126/64  Pulse: 60  Resp: 18  Temp: 97.8 F (36.6 C)  SpO2: 97%   Vitals:   12/30/17 1305  Weight: 145 lb (65.8 kg)  Height: _0  (1.778 m)   Body mass index is 20.81 kg/m. Ideal Body Weight: Weight in (lb) to have BMI = 25: 173.9  GEN: WDWN, NAD, Non-toxic, A & O x 3, pale as per his norm HEENT: Atraumatic, Normocephalic. Neck supple. No masses, No LAD. Ears and Nose: No external deformity. CV: RRR, No M/G/R. No JVD. No thrill. No extra heart  sounds. PULM: CTA B, no wheezes, crackles, rhonchi. No retractions. No resp. distress. No accessory muscle use. EXTR: No c/c/e NEURO walking stiffly on right ankle due to recent fusion operation  PSYCH: Normally interactive. Conversant. Not depressed or anxious appearing.  Calm demeanor.    Assessment  and Plan: Screening for hyperlipidemia - Plan: Lipid panel  Depression - Plan: citalopram (CELEXA) 40 MG tablet  Gastroesophageal reflux disease without esophagitis - Plan: pantoprazole (PROTONIX) 40 MG tablet  Screening for prostate cancer - Plan: PSA  Smoker - Plan: CT CHEST LUNG CA SCREEN LOW DOSE W/O CM  Following up on his routine health concerns today Refilled celexa and protonix PSA and lipid panel today Ordered screening CT chest   Will plan further follow- up pending labs. Recheck in 6 months   Signed Lamar Blinks, MD  Received his labs, letter to pt  Results for orders placed or performed in visit on 12/30/17  PSA  Result Value Ref Range   PSA 0.74 0.10 - 4.00 ng/mL  Lipid panel  Result Value Ref Range   Cholesterol 165 0 - 200 mg/dL   Triglycerides 123.0 0.0 - 149.0 mg/dL   HDL 47.90 >39.00 mg/dL   VLDL 24.6 0.0 - 40.0 mg/dL   LDL Cholesterol 93 0 - 99 mg/dL   Total CHOL/HDL Ratio 3    NonHDL 117.42

## 2017-12-30 ENCOUNTER — Encounter: Payer: Self-pay | Admitting: Family Medicine

## 2017-12-30 ENCOUNTER — Ambulatory Visit (INDEPENDENT_AMBULATORY_CARE_PROVIDER_SITE_OTHER): Payer: Medicare HMO | Admitting: Family Medicine

## 2017-12-30 VITALS — BP 126/64 | HR 60 | Temp 97.8°F | Resp 18 | Ht 70.0 in | Wt 145.0 lb

## 2017-12-30 DIAGNOSIS — Z13220 Encounter for screening for lipoid disorders: Secondary | ICD-10-CM

## 2017-12-30 DIAGNOSIS — D66 Hereditary factor VIII deficiency: Secondary | ICD-10-CM | POA: Diagnosis not present

## 2017-12-30 DIAGNOSIS — Z125 Encounter for screening for malignant neoplasm of prostate: Secondary | ICD-10-CM | POA: Diagnosis not present

## 2017-12-30 DIAGNOSIS — K7460 Unspecified cirrhosis of liver: Secondary | ICD-10-CM | POA: Diagnosis not present

## 2017-12-30 DIAGNOSIS — K219 Gastro-esophageal reflux disease without esophagitis: Secondary | ICD-10-CM | POA: Diagnosis not present

## 2017-12-30 DIAGNOSIS — F329 Major depressive disorder, single episode, unspecified: Secondary | ICD-10-CM

## 2017-12-30 DIAGNOSIS — F17200 Nicotine dependence, unspecified, uncomplicated: Secondary | ICD-10-CM

## 2017-12-30 DIAGNOSIS — F32A Depression, unspecified: Secondary | ICD-10-CM

## 2017-12-30 MED ORDER — CITALOPRAM HYDROBROMIDE 40 MG PO TABS: 40.0000 mg | ORAL_TABLET | Freq: Every day | ORAL | 3 refills | Status: DC

## 2017-12-30 MED ORDER — PANTOPRAZOLE SODIUM 40 MG PO TBEC: 40.0000 mg | DELAYED_RELEASE_TABLET | Freq: Every day | ORAL | 1 refills | Status: DC

## 2017-12-30 NOTE — Patient Instructions (Addendum)
Good to see you today! I will be in touch with your labs We will set you up for a screening CT to look for any sign of lung cancer We refilled your citalopram and acid medication today  Let's plan to visit in about 6 months

## 2017-12-31 LAB — LIPID PANEL
CHOL/HDL RATIO: 3
CHOLESTEROL: 165 mg/dL (ref 0–200)
HDL: 47.9 mg/dL (ref >39.00)
LDL CALC: 93 mg/dL (ref 0–99)
NONHDL: 117.42
TRIGLYCERIDES: 123 mg/dL (ref 0.0–149.0)
VLDL: 24.6 mg/dL (ref 0.0–40.0)

## 2017-12-31 LAB — PSA: PSA: 0.74 ng/mL (ref 0.10–4.00)

## 2018-01-01 ENCOUNTER — Encounter: Payer: Self-pay | Admitting: Family Medicine

## 2018-01-01 ENCOUNTER — Ambulatory Visit (HOSPITAL_BASED_OUTPATIENT_CLINIC_OR_DEPARTMENT_OTHER)
Admission: RE | Admit: 2018-01-01 | Discharge: 2018-01-01 | Disposition: A | Discharge status: Home / Self Care | Payer: Medicare HMO | Source: Ambulatory Visit | Attending: Family Medicine | Admitting: Family Medicine

## 2018-01-01 DIAGNOSIS — Z122 Encounter for screening for malignant neoplasm of respiratory organs: Secondary | ICD-10-CM | POA: Insufficient documentation

## 2018-01-01 DIAGNOSIS — K8020 Calculus of gallbladder without cholecystitis without obstruction: Secondary | ICD-10-CM | POA: Insufficient documentation

## 2018-01-01 DIAGNOSIS — J439 Emphysema, unspecified: Secondary | ICD-10-CM | POA: Diagnosis not present

## 2018-01-01 DIAGNOSIS — F17210 Nicotine dependence, cigarettes, uncomplicated: Secondary | ICD-10-CM | POA: Diagnosis not present

## 2018-01-01 DIAGNOSIS — I700 Atherosclerosis of aorta: Secondary | ICD-10-CM | POA: Diagnosis not present

## 2018-01-01 DIAGNOSIS — F17200 Nicotine dependence, unspecified, uncomplicated: Secondary | ICD-10-CM | POA: Diagnosis not present

## 2018-01-14 ENCOUNTER — Telehealth: Payer: Self-pay

## 2018-01-14 NOTE — Telephone Encounter (Signed)
Called him back- impression as follows: IMPRESSION: 1. Lung-RADS 2, benign appearance or behavior. Continue annual screening with low-dose chest CT without contrast in 12 months. 2.  Aortic atherosclerosis (ICD10-170.0). 3.  Emphysema (ICD10-J43.9). 4.  Cholelithiasis.  Went over his questions about above findings

## 2018-01-14 NOTE — Telephone Encounter (Signed)
Copied from CRM [id number]. Topic: Quick Communication - See Telephone Encounter >> Jan 14, 2018 10:14 AM Waymon Amato wrote: Pt is needing to discuss his ct scan of his lungs with Dr. Patsy Lager it was done about a week ago and got the results in mail on Friday   Best number [phone]

## 2018-02-02 ENCOUNTER — Other Ambulatory Visit: Payer: Self-pay | Admitting: Family Medicine

## 2018-02-02 DIAGNOSIS — K219 Gastro-esophageal reflux disease without esophagitis: Secondary | ICD-10-CM

## 2018-02-19 DIAGNOSIS — Z95828 Presence of other vascular implants and grafts: Secondary | ICD-10-CM | POA: Diagnosis not present

## 2018-02-19 DIAGNOSIS — M1990 Unspecified osteoarthritis, unspecified site: Secondary | ICD-10-CM | POA: Diagnosis not present

## 2018-02-19 DIAGNOSIS — B1920 Unspecified viral hepatitis C without hepatic coma: Secondary | ICD-10-CM | POA: Diagnosis not present

## 2018-02-19 DIAGNOSIS — R52 Pain, unspecified: Secondary | ICD-10-CM | POA: Diagnosis not present

## 2018-02-19 DIAGNOSIS — D66 Hereditary factor VIII deficiency: Secondary | ICD-10-CM | POA: Diagnosis not present

## 2018-07-02 ENCOUNTER — Ambulatory Visit: Payer: Medicare HMO | Admitting: Family Medicine

## 2018-07-23 ENCOUNTER — Telehealth: Payer: Self-pay | Admitting: Family Medicine

## 2018-07-23 NOTE — Telephone Encounter (Signed)
Called patient to schedule AWV, but no answer. Will try to call again at a later time. SF

## 2018-07-30 ENCOUNTER — Other Ambulatory Visit: Payer: Self-pay

## 2018-07-30 ENCOUNTER — Encounter: Payer: Self-pay | Admitting: Family Medicine

## 2018-07-30 ENCOUNTER — Ambulatory Visit (INDEPENDENT_AMBULATORY_CARE_PROVIDER_SITE_OTHER): Payer: Medicare HMO | Admitting: Family Medicine

## 2018-07-30 DIAGNOSIS — K7460 Unspecified cirrhosis of liver: Secondary | ICD-10-CM | POA: Diagnosis not present

## 2018-07-30 DIAGNOSIS — D66 Hereditary factor VIII deficiency: Secondary | ICD-10-CM | POA: Diagnosis not present

## 2018-07-30 DIAGNOSIS — R61 Generalized hyperhidrosis: Secondary | ICD-10-CM

## 2018-07-30 MED ORDER — ALUMINUM CHLORIDE 20 % EX SOLN: Freq: Every day | CUTANEOUS | 2 refills | Status: DC

## 2018-07-30 NOTE — Progress Notes (Signed)
[place] at [facility] [address], [place], [place] [address] [phone]  Date:  07/30/2018   Name:  Darren Torres   DOB:  1955/06/23   MRN:  [id number]  PCP:  Darreld Mclean, MD    Chief Complaint: No chief complaint on file.   History of Present Illness:  Darren Torres is a 63 y.o. very pleasant male patient who presents with the following:  Virtual visit for follow-up today Patient location is home Provider location is office Patient ID confirmed with 2 identifiers, he gives consent for virtual visit today- phone visit   I last saw Darren Torres in the office in October He has history of cirrhosis, hemophilia, chronic pain and depression Hematology actually manages his chronic pain with long-acting and short acting morphine He is a patient of [facility] pediatric hematology oncology clinic He did have hepatitis C, but this has been cured. He also had an ankle fusion about 18 months ago.  In May 2019 he had some of the hardware removed due to chronic postoperative pain and had improvement of pain.  This continues to do pretty well The right is not quite as good as the left, but he can manage it  We did a lipid panel and PSA in October.  He is otherwise due for follow-up labs Negative lung cancer screening CT in October- he does not want to repeat this year, but would perhaps do the following year Colon cancer screening is up-to-date  Reviewed most recent heme-onc clinic note from December.  They note severe factor VIII deficiency, hemophilic arthropathy, hep C infection status post Harvoni He uses Kogenate 3k every 2 days, or daily if active bleeding- he was doing every 3rd day, but this was not often enough to control his sx  He has a PICC line  I asked about his citalopram today.  He states he is using this for "hand sweating" and not for mood.  It sounds like he started this medication when he was using  interferon for hepatitis C.  However he notes that recently his hand swelling has become more problematic again.  He is always had issues with both his hands and feet sweating.  Patient Active Problem List   Diagnosis Date Noted  . Hepatic cirrhosis ([place]) 05/30/2015  . Cataract 04/28/2012  . DJD (degenerative joint disease) 04/28/2012  . Abnormality of gait 04/28/2012  . Hemophilia ([place]) 05/28/2011  . Hepatitis C 05/28/2011  . Chronic pain 05/28/2011  . Depression 05/28/2011    Past Medical History:  Diagnosis Date  . Chronic kidney disease   . Clotting disorder ([place])   . Depression   . Hemophilia ([place])   . Hepatitis C     Past Surgical History:  Procedure Laterality Date  . JOINT REPLACEMENT      Social History   Tobacco Use  . Smoking status: Current Every Day Smoker    Packs/day: 0.50    Types: Cigarettes  Substance Use Topics  . Alcohol use: No  . Drug use: No    Family History  Problem Relation Age of Onset  . Arthritis Mother   . Heart disease Father   . Heart disease Maternal Grandmother   . Heart disease Maternal Grandfather   . Cancer Paternal Grandmother   . Heart disease Paternal Grandfather     Allergies  Allergen Reactions  . Chantix [Varenicline Tartrate] Other (See Comments)    Changes mood  .  Nsaids Swelling and Other (See Comments)    Also vomit Due to blood thinner  . Wellbutrin [Bupropion Hcl] Palpitations  . Asa Arthritis Strength-Antacid [Aspirin Buffered] Other (See Comments)    Due to blood thinner  . Other     cryopercipate    Medication list has been reviewed and updated.  Current Outpatient Medications on File Prior to Visit  Medication Sig Dispense Refill  . Antihemophilic Factor, Recomb, (HELIXATE FS) 3000 units KIT every other day.    Marland Kitchen BIOTIN PO Take 1 tablet by mouth daily.    Sarajane Marek Sodium 30-100 MG CAPS Take by mouth as needed.     . citalopram (CELEXA) 40 MG tablet Take 1 tablet (40 mg total) by  mouth daily. 90 tablet 3  . morphine (KADIAN) 30 MG 24 hr capsule Take 30 mg by mouth daily.    Marland Kitchen morphine (MSIR) 15 MG tablet Take 15 mg by mouth every 4 (four) hours as needed.    . Multiple Vitamin (MULTIVITAMIN) capsule Take 1 capsule by mouth daily.    . pantoprazole (PROTONIX) 40 MG tablet TAKE 1 TABLET (40 MG TOTAL) BY MOUTH DAILY. 90 tablet 1   No current facility-administered medications on file prior to visit.     Review of Systems:  As per HPI- otherwise negative. No fever or cough  Physical Examination: There were no vitals filed for this visit. There were no vitals filed for this visit. There is no height or weight on file to calculate BMI. Ideal Body Weight:    His BP is checked by a nurse weekly 116/70s generally    Assessment and Plan: Cirrhosis of liver without ascites, unspecified hepatic cirrhosis type ([place]) - Plan: CBC, Comprehensive metabolic panel  Hemophilia ([place]) - Plan: CBC, Comprehensive metabolic panel  Hyperhidrosis - Plan: aluminum chloride (DRYSOL) 20 % external solution    Discussed his citalopram today.  Darren Torres relates that he started taking this years ago when he was on interferon.  He has continued taking it, because he was told it helped to reduce troublesome palm sweating.  He notes his hands tend to sweat so much that he is embarrassed to shake hands, and they can make it difficult for him to write or draws he enjoys doing  We will have him try Drysol antiperspirant.  He can apply this on his hands at bedtime.  The computer did warn me about cross-reactivity between his aspirin allergy and Drysol.  However patient confirms is not actually allergic to aspirin, he simply has been avoided due to his hemophilia  As the citalopram no longer seems to be helping, he would like to come off of it.  He is taking 40 mg tablets.  He was increased to 20 mg daily for about 2 weeks, then can take 20 mg every other day for 1 week and then stop  I ordered  routine labs which he will have done as a lab visit only at his convenience  Spoke with pt for 13:42 today Signed Lamar Blinks, MD

## 2018-07-31 ENCOUNTER — Other Ambulatory Visit: Payer: Self-pay

## 2018-07-31 DIAGNOSIS — K219 Gastro-esophageal reflux disease without esophagitis: Secondary | ICD-10-CM

## 2018-07-31 MED ORDER — PANTOPRAZOLE SODIUM 40 MG PO TBEC: 40.0000 mg | DELAYED_RELEASE_TABLET | Freq: Every day | ORAL | 1 refills | Status: DC

## 2018-08-06 ENCOUNTER — Other Ambulatory Visit: Payer: Self-pay

## 2018-08-06 ENCOUNTER — Other Ambulatory Visit (INDEPENDENT_AMBULATORY_CARE_PROVIDER_SITE_OTHER): Payer: Medicare HMO

## 2018-08-06 DIAGNOSIS — K7460 Unspecified cirrhosis of liver: Secondary | ICD-10-CM | POA: Diagnosis not present

## 2018-08-06 DIAGNOSIS — D66 Hereditary factor VIII deficiency: Secondary | ICD-10-CM | POA: Diagnosis not present

## 2018-08-07 ENCOUNTER — Encounter: Payer: Self-pay | Admitting: Family Medicine

## 2018-08-07 LAB — CBC
HCT: 41.9 % (ref 39.0–52.0)
Hemoglobin: 14.7 g/dL (ref 13.0–17.0)
MCHC: 35.1 g/dL (ref 30.0–36.0)
MCV: 92.5 fl (ref 78.0–100.0)
Platelets: 171 10*3/uL (ref 150.0–400.0)
RBC: 4.53 Mil/uL (ref 4.22–5.81)
RDW: 13.5 % (ref 11.5–15.5)
WBC: 4.9 10*3/uL (ref 4.0–10.5)

## 2018-08-07 LAB — COMPREHENSIVE METABOLIC PANEL
ALT: 11 U/L (ref 0–53)
AST: 19 U/L (ref 0–37)
Albumin: 4.3 g/dL (ref 3.5–5.2)
Alkaline Phosphatase: 116 U/L (ref 39–117)
BUN: 12 mg/dL (ref 6–23)
CO2: 29 mEq/L (ref 19–32)
Calcium: 9.4 mg/dL (ref 8.4–10.5)
Chloride: 101 mEq/L (ref 96–112)
Creatinine, Ser: 0.68 mg/dL (ref 0.40–1.50)
GFR: 118.52 mL/min (ref >60.00)
Glucose, Bld: 110 mg/dL — ABNORMAL HIGH (ref 70–99)
Potassium: 4.2 mEq/L (ref 3.5–5.1)
Sodium: 139 mEq/L (ref 135–145)
Total Bilirubin: 0.4 mg/dL (ref 0.2–1.2)
Total Protein: 7 g/dL (ref 6.0–8.3)

## 2018-09-03 DIAGNOSIS — K219 Gastro-esophageal reflux disease without esophagitis: Secondary | ICD-10-CM | POA: Diagnosis not present

## 2018-09-03 DIAGNOSIS — M25522 Pain in left elbow: Secondary | ICD-10-CM | POA: Diagnosis not present

## 2018-09-03 DIAGNOSIS — M25022 Hemarthrosis, left elbow: Secondary | ICD-10-CM | POA: Diagnosis not present

## 2018-09-03 DIAGNOSIS — D66 Hereditary factor VIII deficiency: Secondary | ICD-10-CM | POA: Diagnosis not present

## 2018-09-03 DIAGNOSIS — B182 Chronic viral hepatitis C: Secondary | ICD-10-CM | POA: Diagnosis not present

## 2018-09-03 DIAGNOSIS — M25061 Hemarthrosis, right knee: Secondary | ICD-10-CM | POA: Diagnosis not present

## 2018-09-03 DIAGNOSIS — Z79899 Other long term (current) drug therapy: Secondary | ICD-10-CM | POA: Diagnosis not present

## 2018-09-03 DIAGNOSIS — D68311 Acquired hemophilia: Secondary | ICD-10-CM | POA: Diagnosis not present

## 2018-09-03 DIAGNOSIS — M25422 Effusion, left elbow: Secondary | ICD-10-CM | POA: Diagnosis not present

## 2018-09-03 DIAGNOSIS — F17210 Nicotine dependence, cigarettes, uncomplicated: Secondary | ICD-10-CM | POA: Diagnosis not present

## 2018-09-03 DIAGNOSIS — M1990 Unspecified osteoarthritis, unspecified site: Secondary | ICD-10-CM | POA: Diagnosis not present

## 2018-09-04 DIAGNOSIS — M25522 Pain in left elbow: Secondary | ICD-10-CM | POA: Diagnosis not present

## 2018-09-04 DIAGNOSIS — M25061 Hemarthrosis, right knee: Secondary | ICD-10-CM | POA: Diagnosis not present

## 2018-09-04 DIAGNOSIS — M1990 Unspecified osteoarthritis, unspecified site: Secondary | ICD-10-CM | POA: Diagnosis not present

## 2018-09-04 DIAGNOSIS — D68311 Acquired hemophilia: Secondary | ICD-10-CM | POA: Diagnosis not present

## 2018-09-04 DIAGNOSIS — F17210 Nicotine dependence, cigarettes, uncomplicated: Secondary | ICD-10-CM | POA: Diagnosis not present

## 2018-09-04 DIAGNOSIS — M25022 Hemarthrosis, left elbow: Secondary | ICD-10-CM | POA: Diagnosis not present

## 2018-09-04 DIAGNOSIS — D66 Hereditary factor VIII deficiency: Secondary | ICD-10-CM | POA: Diagnosis not present

## 2018-09-04 DIAGNOSIS — K219 Gastro-esophageal reflux disease without esophagitis: Secondary | ICD-10-CM | POA: Diagnosis not present

## 2018-09-04 DIAGNOSIS — B182 Chronic viral hepatitis C: Secondary | ICD-10-CM | POA: Diagnosis not present

## 2018-09-04 MED ORDER — GENERIC EXTERNAL MEDICATION
1250.00 | Status: DC
Start: 2018-09-05 — End: 2018-09-04

## 2018-09-04 MED ORDER — CHOLECALCIFEROL 25 MCG (1000 UT) PO TABS
1000.00 | ORAL_TABLET | ORAL | Status: DC
Start: 2018-09-05 — End: 2018-09-04

## 2018-09-04 MED ORDER — BAYER WOMENS 81-300 MG PO TABS
40.00 | ORAL_TABLET | ORAL | Status: DC
Start: 2018-09-05 — End: 2018-09-04

## 2018-09-04 MED ORDER — HCA TUSSIN 100 MG/5ML PO SYRP
15.00 | ORAL_SOLUTION | ORAL | Status: DC
Start: ? — End: 2018-09-04

## 2018-09-04 MED ORDER — ISOVUE-M 300 61 % IJ SOLN
4.00 | INTRAMUSCULAR | Status: DC
Start: ? — End: 2018-09-04

## 2018-09-04 MED ORDER — CETIRIZINE HCL 10 MG PO TABS
10.00 | ORAL_TABLET | ORAL | Status: DC
Start: ? — End: 2018-09-04

## 2018-09-04 MED ORDER — GENERIC EXTERNAL MEDICATION
1.00 | Status: DC
Start: 2018-09-05 — End: 2018-09-04

## 2018-09-04 MED ORDER — REPAGLINIDE 2 MG PO TABS
30.00 | ORAL_TABLET | ORAL | Status: DC
Start: 2018-09-04 — End: 2018-09-04

## 2018-09-04 MED ORDER — ANTIHEM FACTOR RECOMB (RFVIII) 1000 UNITS IV KIT
3000.00 | PACK | INTRAVENOUS | Status: DC
Start: 2018-09-05 — End: 2018-09-04

## 2018-09-04 MED ORDER — ZINC SULFATE 220 (50 ZN) MG PO CAPS
220.00 | ORAL_CAPSULE | ORAL | Status: DC
Start: 2018-09-05 — End: 2018-09-04

## 2018-09-04 MED ORDER — QUINERVA 260 MG PO TABS
650.00 | ORAL_TABLET | ORAL | Status: DC
Start: ? — End: 2018-09-04

## 2018-09-04 MED ORDER — AVELOX 400 MG PO TABS
1.00 | ORAL_TABLET | ORAL | Status: DC
Start: 2018-09-04 — End: 2018-09-04

## 2018-09-04 MED ORDER — DOCUSATE SODIUM 100 MG PO CAPS
100.00 | ORAL_CAPSULE | ORAL | Status: DC
Start: 2018-09-04 — End: 2018-09-04

## 2018-09-15 NOTE — Progress Notes (Deleted)
[place] at [facility] [address], [place], [place] [address] [phone]  Date:  09/17/2018   Name:  Darren Torres   DOB:  1955-05-06   MRN:  [id number]  PCP:  Darreld Mclean, MD    Chief Complaint: No chief complaint on file.   History of Present Illness:  Darren Torres is a 63 y.o. very pleasant male patient who presents with the following:  Very nice gentleman with history of hemophilia, hepatic cirrhosis/history of hepatitis C status post curative treatment with Harvoni Here today for a follow-up visit from recent hospital admission He was seen in the [facility] ER on June 18 with left elbow pain and swelling.  No known trauma He was given a dose of his factor VIII replacement and pain control for spontaneous hemarthrosis  He was kept overnight for observation-  ASSESSMENT/PLAN: Darren Torres is a 63 y.o. 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   #Hemophilia A/severe factor VIII deficiency, POA, active #Left elbow hemarthrosis, POA, active --24-hour history of increased swelling, pain of the left elbow. Patient received approximately 2500 units factor VIII in the ED. Factor VIII level on presentation was 79%. X-ray left elbow showed no acute fracture or malalignment, joint effusion, joint space narrowing, bone-on-bone contact consistent with known hemophilia. Hematology was consulted in the ED. --PLAN:  -Hematology consulted, we appreciate their recommendations - patient to request that his wife bring home factor incase further dosing needed - observation overnight with pain control - repeat FVIII tomorrow morning  - monitor closely for development of compartment syndrome with neurovascular  checks every 4 hrs. DO NOT test with manometry  -Patient status post 2714 units in the ED. -Pain control with patient's home morphine extended release 30 mg twice daily - 15 mg morphine immediate release every 4 hours as needed -Continue calcium supplementation, vitamin D supplementation, zinc supplementation    Patient Active Problem List   Diagnosis Date Noted  . Hepatic cirrhosis ([place]) 05/30/2015  . Cataract 04/28/2012  . DJD (degenerative joint disease) 04/28/2012  . Abnormality of gait 04/28/2012  . Hemophilia ([place]) 05/28/2011  . Hepatitis C 05/28/2011  . Chronic pain 05/28/2011  . Depression 05/28/2011    Past Medical History:  Diagnosis Date  . Chronic kidney disease   . Clotting disorder ([place])   . Depression   . Hemophilia ([place])   . Hepatitis C     Past Surgical History:  Procedure Laterality Date  . JOINT REPLACEMENT      Social History   Tobacco Use  . Smoking status: Current Every Day Smoker    Packs/day: 0.50    Types: Cigarettes  Substance Use Topics  . Alcohol use: No  . Drug use: No    Family History  Problem Relation Age of Onset  . Arthritis Mother   . Heart disease Father   . Heart disease Maternal Grandmother   . Heart disease Maternal Grandfather   . Cancer Paternal Grandmother   . Heart disease Paternal Grandfather     Allergies  Allergen Reactions  . Chantix [Varenicline Tartrate] Other (See Comments)    Changes mood  . Nsaids Swelling and Other (See Comments)    Also vomit Due to blood thinner  . Wellbutrin [Bupropion Hcl] Palpitations  .  Asa Arthritis Strength-Antacid [Aspirin Buffered] Other (See Comments)    Due to blood thinner  . Other     cryopercipate    Medication list has been reviewed and updated.  Current Outpatient Medications on File Prior to Visit  Medication Sig Dispense Refill  . aluminum chloride (DRYSOL) 20 % external solution Apply topically at bedtime. Use on hands to reduce sweating.  Once excess  sweating is controlled can use just 1-2x a week 60 mL 2  . Antihemophilic Factor, Recomb, (HELIXATE FS) 3000 units KIT every other day.    Marland Kitchen BIOTIN PO Take 1 tablet by mouth daily.    Sarajane Marek Sodium 30-100 MG CAPS Take by mouth as needed.     . citalopram (CELEXA) 40 MG tablet Take 1 tablet (40 mg total) by mouth daily. 90 tablet 3  . morphine (KADIAN) 30 MG 24 hr capsule Take 30 mg by mouth daily.    Marland Kitchen morphine (MSIR) 15 MG tablet Take 15 mg by mouth every 4 (four) hours as needed.    . Multiple Vitamin (MULTIVITAMIN) capsule Take 1 capsule by mouth daily.    . pantoprazole (PROTONIX) 40 MG tablet Take 1 tablet (40 mg total) by mouth daily. 90 tablet 1   No current facility-administered medications on file prior to visit.     Review of Systems:  As per HPI- otherwise negative.   Physical Examination: There were no vitals filed for this visit. There were no vitals filed for this visit. There is no height or weight on file to calculate BMI. Ideal Body Weight:    GEN: WDWN, NAD, Non-toxic, A & O x 3 HEENT: Atraumatic, Normocephalic. Neck supple. No masses, No LAD. Ears and Nose: No external deformity. CV: RRR, No M/G/R. No JVD. No thrill. No extra heart sounds. PULM: CTA B, no wheezes, crackles, rhonchi. No retractions. No resp. distress. No accessory muscle use. ABD: S, NT, ND, +BS. No rebound. No HSM. EXTR: No c/c/e NEURO Normal gait.  PSYCH: Normally interactive. Conversant. Not depressed or anxious appearing.  Calm demeanor.    Assessment and Plan: ***  Signed Lamar Blinks, MD

## 2018-09-17 ENCOUNTER — Inpatient Hospital Stay: Payer: Medicare HMO | Admitting: Family Medicine

## 2018-09-22 ENCOUNTER — Other Ambulatory Visit: Payer: Self-pay

## 2018-09-23 NOTE — Progress Notes (Signed)
[place] at [facility] [address], [place], [place] [address] [phone]  Date:  09/24/2018   Name:  Darren Torres   DOB:  [birth year]   MRN:  [id number]  PCP:  Darreld Mclean, MD    Chief Complaint: Hospitalization Follow-up (1 week follow up)   History of Present Illness:  Darren Torres is a 63 y.o. very pleasant male patient who presents with the following:  Following up today from recent hospital admission Darren Torres has history of hemophilia, hepatitis C status post curative treatment, chronic kidney disease  He was admitted to [facility] hematology/ oncology on June 17 due to a hemarthrosis of the left elbow He was kept just overnight - he reports that he left AMA as he was not allowed to "get some fresh air outdoors" while admitted  He a long history of pain and restricted range of motion to his elbow, but the acute bleed was really spontaneous He will have left elbow pain when he tries to use the left hand The right elbow is also problematic, but not as much Using clotting factor will generally help- however this time was different- his elbow was very painful and he really could not move the arm or use his hand  Elbow films [facility] as follows: 1.  No acute fracture or malalignment. 2.  No elbow joint effusion. 3.  Interval progression of changes compatible with known hemophilia, including diffuse joint space narrowing and bone on bone contact.  No bleeding since he got home -he seems to be about back to his baseline  Darren Torres did stop his Celexa a few weeks ago, after gradual taper. His wife, and the patient himself, notice that his mood is not as good without the medication.  He will likely go back on-advised him to start with 1/2 tablet, can increase to a whole tablet after 2 or 3 weeks  Patient Active Problem List   Diagnosis Date Noted  . Hepatic cirrhosis ([place]) 05/30/2015  . Cataract  04/28/2012  . DJD (degenerative joint disease) 04/28/2012  . Abnormality of gait 04/28/2012  . Hemophilia ([place]) 05/28/2011  . Hepatitis C 05/28/2011  . Chronic pain 05/28/2011  . Depression 05/28/2011    Past Medical History:  Diagnosis Date  . Chronic kidney disease   . Clotting disorder ([place])   . Depression   . Hemophilia ([place])   . Hepatitis C     Past Surgical History:  Procedure Laterality Date  . JOINT REPLACEMENT      Social History   Tobacco Use  . Smoking status: Current Every Day Smoker    Packs/day: 0.50    Types: Cigarettes  Substance Use Topics  . Alcohol use: No  . Drug use: No    Family History  Problem Relation Age of Onset  . Arthritis Mother   . Heart disease Father   . Heart disease Maternal Grandmother   . Heart disease Maternal Grandfather   . Cancer Paternal Grandmother   . Heart disease Paternal Grandfather     Allergies  Allergen Reactions  . Chantix [Varenicline Tartrate] Other (See Comments)    Changes mood  . Nsaids Swelling and Other (See Comments)    Also vomit Due to blood thinner  . Wellbutrin [Bupropion Hcl] Palpitations  . Asa Arthritis Strength-Antacid [Aspirin Buffered] Other (See Comments)    Due to blood thinner  . Other     cryopercipate  Medication list has been reviewed and updated.  Current Outpatient Medications on File Prior to Visit  Medication Sig Dispense Refill  . aluminum chloride (DRYSOL) 20 % external solution Apply topically at bedtime. Use on hands to reduce sweating.  Once excess sweating is controlled can use just 1-2x a week 60 mL 2  . Antihemophilic Factor, Recomb, (HELIXATE FS) 3000 units KIT every other day.    Marland Kitchen BIOTIN PO Take 1 tablet by mouth daily.    Sarajane Marek Sodium 30-100 MG CAPS Take by mouth as needed.     . citalopram (CELEXA) 40 MG tablet Take 1 tablet (40 mg total) by mouth daily. 90 tablet 3  . morphine (KADIAN) 30 MG 24 hr capsule Take 30 mg by mouth daily.    Marland Kitchen  morphine (MSIR) 15 MG tablet Take 15 mg by mouth every 4 (four) hours as needed.    . Multiple Vitamin (MULTIVITAMIN) capsule Take 1 capsule by mouth daily.    . pantoprazole (PROTONIX) 40 MG tablet Take 1 tablet (40 mg total) by mouth daily. 90 tablet 1   No current facility-administered medications on file prior to visit.     Review of Systems:  As per HPI- otherwise negative.   Physical Examination: Vitals:   09/24/18 1303  BP: 112/80  Pulse: 88  Resp: 16  Temp: 98 F (36.7 C)  SpO2: 98%   Vitals:   09/24/18 1303  Weight: 135 lb (61.2 kg)  Height: '5\' 10"'  (1.778 m)   Body mass index is 19.37 kg/m. Ideal Body Weight: Weight in (lb) to have BMI = 25: 173.9  GEN: WDWN, NAD, Non-toxic, A & O x 3, slim built, looks his normal self HEENT: Atraumatic, Normocephalic. Neck supple. No masses, No LAD. Ears and Nose: No external deformity. CV: RRR, No M/G/R. No JVD. No thrill. No extra heart sounds. PULM: CTA B, no wheezes, crackles, rhonchi. No retractions. No resp. distress. No accessory muscle use. ABD: S, NT, ND, +BS. No rebound. No HSM. EXTR: No c/c/e NEURO Normal gait.  PSYCH: Normally interactive. Conversant. Not depressed or anxious appearing.  Calm demeanor.  PICC line in the left upper arm He has restricted flexion extension of both elbows, more severe on the left.  He has no pronation/supination function of either elbow   Assessment and Plan:   ICD-10-CM   1. Recurrent major depressive disorder, in partial remission ([place])  F33.41   2. Hemophilia ([place])  D66   3. Other secondary osteoarthritis of left elbow  M19.222    Following up today from recent hospital stay for acute hemarthrosis of left elbow Darren Torres is overall feeling better, though his elbow remains a chronic issue He has a lot of joint damage in his elbows due to his hemophilia.  We did discuss potentially seeing a surgeon about surgery or elbow replacement today.  He will think about this, but is not  ready for consultation yet  Otherwise no more abnormal bleeding, his hemophilia seems back to baseline  Encouraged him to consider going back on Celexa, he plans to start back on at least 20 mg for now  Follow-up: No follow-ups on file.  No orders of the defined types were placed in this encounter.  No orders of the defined types were placed in this encounter.    Signed Lamar Blinks, MD

## 2018-09-24 ENCOUNTER — Encounter: Payer: Self-pay | Admitting: Family Medicine

## 2018-09-24 ENCOUNTER — Ambulatory Visit (INDEPENDENT_AMBULATORY_CARE_PROVIDER_SITE_OTHER): Payer: Medicare HMO | Admitting: Family Medicine

## 2018-09-24 ENCOUNTER — Other Ambulatory Visit: Payer: Self-pay

## 2018-09-24 VITALS — BP 112/80 | HR 88 | Temp 98.0°F | Resp 16 | Ht 70.0 in | Wt 135.0 lb

## 2018-09-24 DIAGNOSIS — D66 Hereditary factor VIII deficiency: Secondary | ICD-10-CM | POA: Diagnosis not present

## 2018-09-24 DIAGNOSIS — M19222 Secondary osteoarthritis, left elbow: Secondary | ICD-10-CM

## 2018-09-24 DIAGNOSIS — F3341 Major depressive disorder, recurrent, in partial remission: Secondary | ICD-10-CM

## 2018-09-24 DIAGNOSIS — K7460 Unspecified cirrhosis of liver: Secondary | ICD-10-CM | POA: Diagnosis not present

## 2018-09-24 NOTE — Patient Instructions (Signed)
It was good to see you today but I am sorry that you had this elbow issue I do think you could potentially have surgery to improve your elbow function and reduce pain- let me know if you are interested in looking into this further As we discussed, perhaps go back on your celexa/ citalopram at a 1/2 tablet daily for 2-3 weeks, then you can increase to a whole tablet again if you like or stay at 1/2   Please see me in about 6 months and take care!

## 2018-11-10 ENCOUNTER — Other Ambulatory Visit: Payer: Self-pay | Admitting: Family Medicine

## 2019-01-21 DIAGNOSIS — Z79899 Other long term (current) drug therapy: Secondary | ICD-10-CM | POA: Diagnosis not present

## 2019-01-21 DIAGNOSIS — D66 Hereditary factor VIII deficiency: Secondary | ICD-10-CM | POA: Diagnosis not present

## 2019-01-21 DIAGNOSIS — Z95828 Presence of other vascular implants and grafts: Secondary | ICD-10-CM | POA: Diagnosis not present

## 2019-02-07 IMAGING — CT CT CHEST LUNG CANCER SCREENING LOW DOSE W/O CM
1 series · 10 of 10 positions shown, 13 images · non-contrast
Comparison: None.

CLINICAL DATA: Current smoker, 44 pack-year history.

EXAM:
CT CHEST WITHOUT CONTRAST LOW-DOSE FOR LUNG CANCER SCREENING
TECHNIQUE: Multidetector CT imaging of the chest was performed following the
standard protocol without IV contrast.

[ct lung segmentation data · axial · 0.67mm/px · z∈[-339,-339]mm · 10 of 311 frames shown]
[frame 1/311  mediastinal]
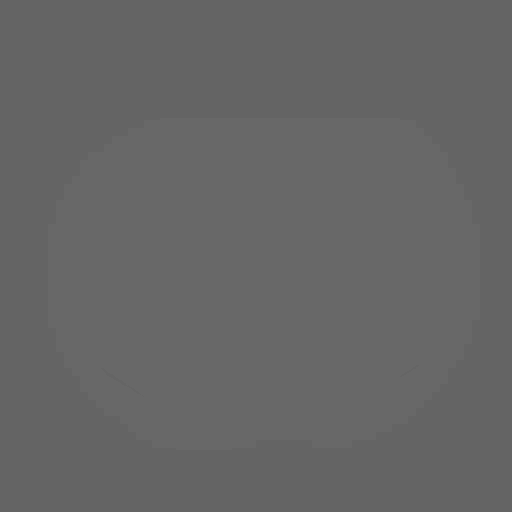
[frame 1/311  lung]
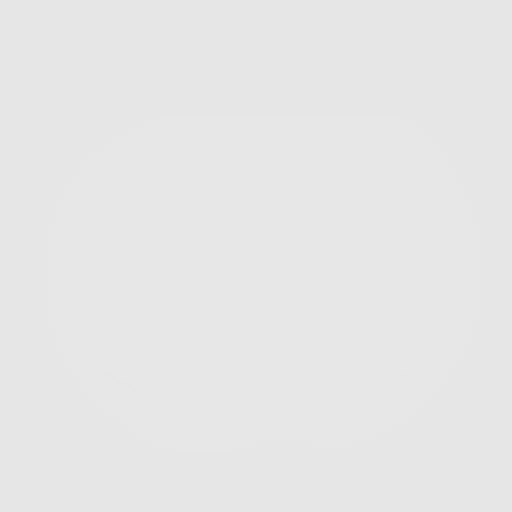
[frame 35/311  lung]
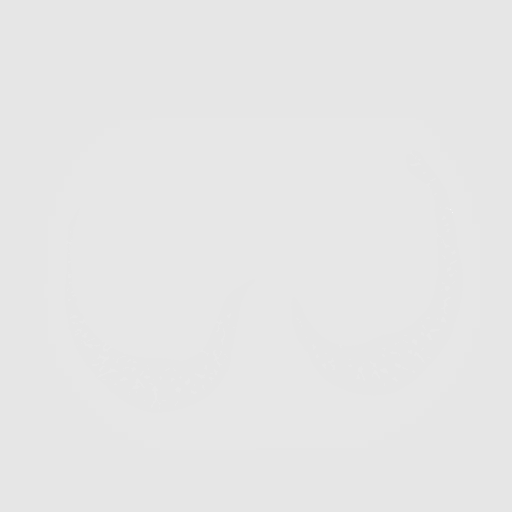
[frame 69/311  lung]
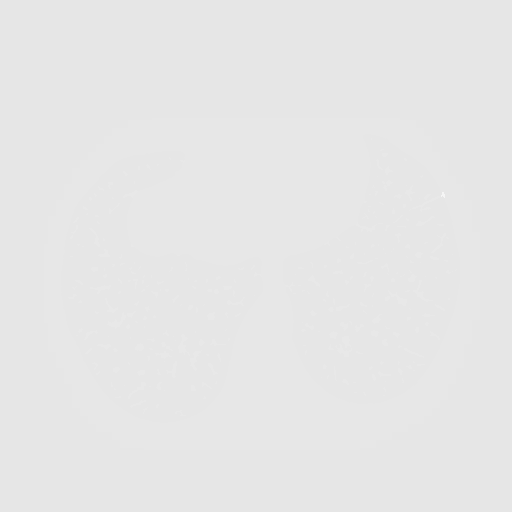
[frame 104/311  lung]
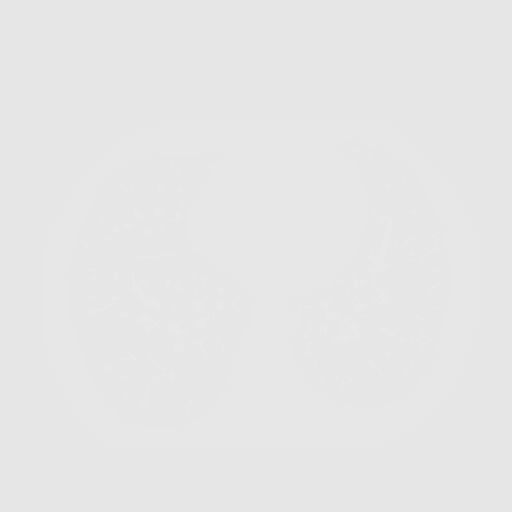
[frame 138/311  mediastinal]
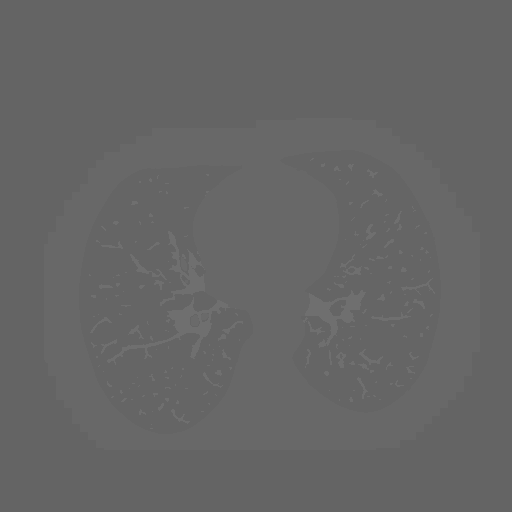
[frame 138/311  lung]
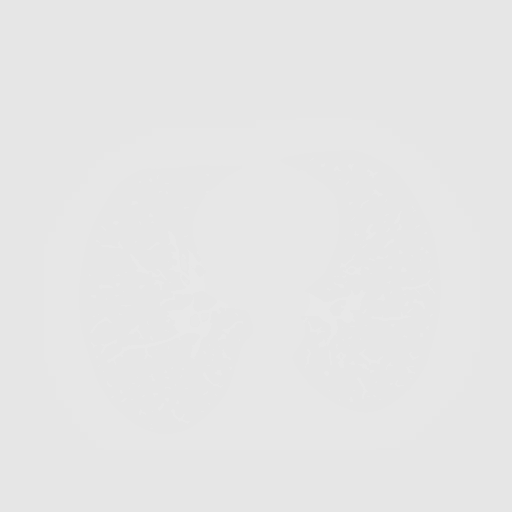
[frame 173/311  lung]
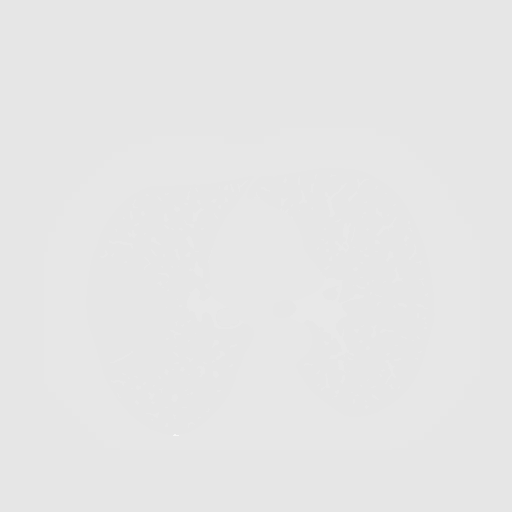
[frame 207/311  lung]
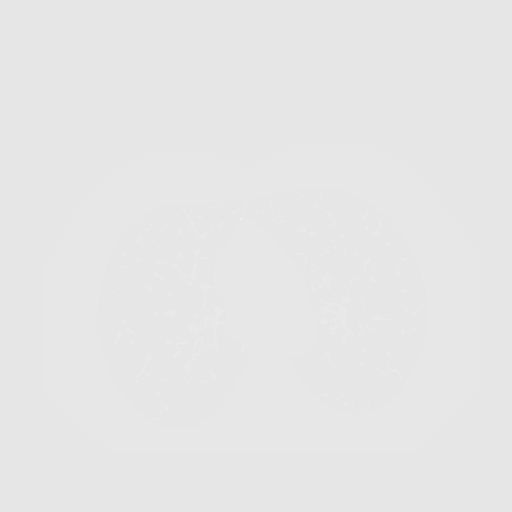
[frame 242/311  lung]
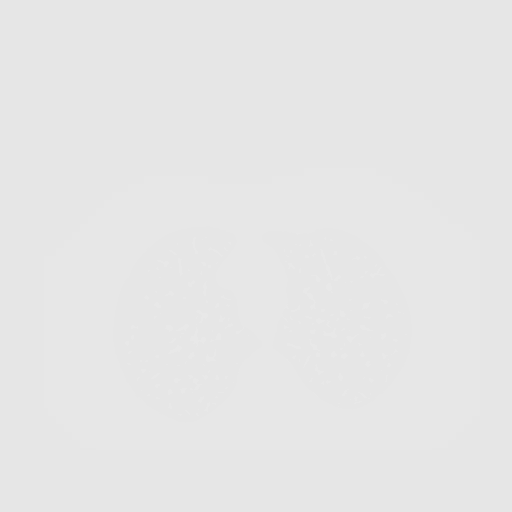
[frame 276/311  mediastinal]
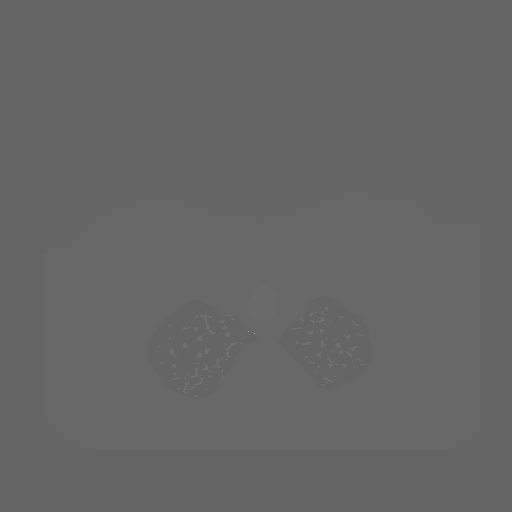
[frame 276/311  lung]
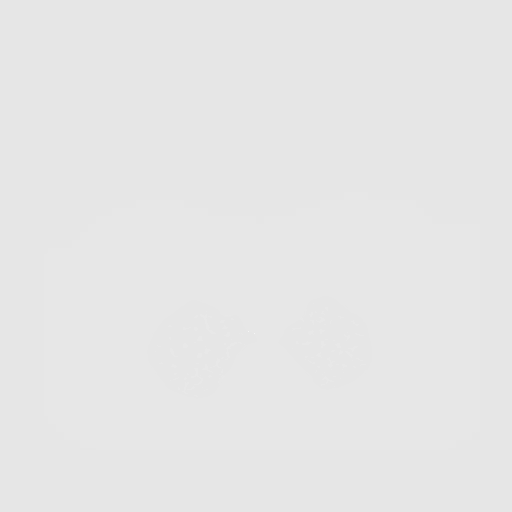
[frame 311/311  lung]
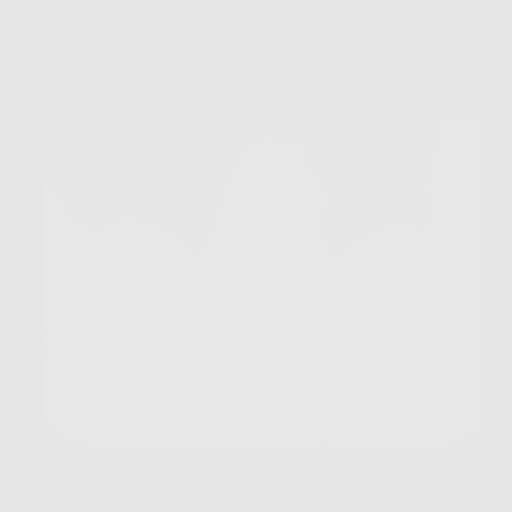

[10 of 10 positions shown; findings below may reference images not displayed]

FINDINGS: Cardiovascular: Left PICC terminates at the SVC RA junction.
Atherosclerotic calcification of the arterial vasculature. Heart
size normal. No pericardial effusion.

Mediastinum/Nodes: No pathologically enlarged mediastinal or
axillary lymph nodes. Hilar regions are difficult to definitively
evaluate without IV contrast but appear grossly unremarkable.
Esophagus is grossly unremarkable.

Lungs/Pleura: Centrilobular emphysema. Pulmonary nodules measure
mm or less in size. No pleural fluid. Airway is unremarkable.

Upper Abdomen: Visualized portion of the liver is unremarkable. A
stone is partially imaged in the gallbladder. Visualized portions of
the adrenal glands, kidneys, spleen, pancreas and stomach are
grossly unremarkable.

Musculoskeletal: Degenerative changes in the left shoulder. Right
shoulder arthroplasty.
IMPRESSION: 1. Lung-RADS 2, benign appearance or behavior. Continue annual
screening with low-dose chest CT without contrast in 12 months.
2.  Aortic atherosclerosis (KGAFN-170.0).
3.  Emphysema (KGAFN-GSD.V).
4. Cholelithiasis.

## 2019-02-17 ENCOUNTER — Other Ambulatory Visit: Payer: Self-pay | Admitting: Family Medicine

## 2019-02-17 DIAGNOSIS — K219 Gastro-esophageal reflux disease without esophagitis: Secondary | ICD-10-CM

## 2019-03-29 NOTE — Progress Notes (Signed)
[place] at [facility] [address], [place], [place] [address] [phone]  Date:  03/30/2019   Name:  Darren Torres   DOB:  August 31, 1955   MRN:  [id number]  PCP:  Darreld Mclean, MD    Chief Complaint: No chief complaint on file.   History of Present Illness:  Darren Torres is a 64 y.o. very pleasant male patient who presents with the following:  Here today for 45-monthfollow-up-virtual visit due to Covid 19 pandemic Patient location is home, provider location is at office Patient identity confirmed with 2 factors, he gives consent for virtual visit today The patient and myself are present on the phone call today  History of hemophilia, chronic pain due to joint disease from hemophilia, hepatitis C status post curative treatment, cirrhosis, chronic kidney disease Last seen by myself in July 2020-at that point he [facility] to hemarthrosis of the left elbow At that time he had stopped his Celexa, both the patient and his wife thought he should go back on it so we restarted the medication He notes that he is taking Celexa and doing well with it, he would like to continue his current dose  Flu vaccine-declines Tetanus vaccine He is taking 30 mg of morphine ER twice daily[facility], last screening October 2019.  Will order CT scan, but patient wishes to wait until pandemic is passed.  He has a hard time wearing a mask and as such is mostly just staying home  Patient Active Problem List   Diagnosis Date Noted  . Hepatic cirrhosis ([place] 05/30/2015  . Cataract 04/28/2012  . DJD (degenerative joint disease) 04/28/2012  . Abnormality of gait 04/28/2012  . Hemophilia ([place] 05/28/2011  . Hepatitis C 05/28/2011  . Chronic pain 05/28/2011  . Depression 05/28/2011    Past Medical History:  Diagnosis Date  .  Chronic kidney disease   . Clotting disorder ([place]   . Depression   . Hemophilia ([place]   . Hepatitis C     Past Surgical History:  Procedure Laterality Date  . JOINT REPLACEMENT      Social History   Tobacco Use  . Smoking status: Current Every Day Smoker    Packs/day: 0.50    Types: Cigarettes  Substance Use Topics  . Alcohol use: No  . Drug use: No    Family History  Problem Relation Age of Onset  . Arthritis Mother   . Heart disease Father   . Heart disease Maternal Grandmother   . Heart disease Maternal Grandfather   . Cancer Paternal Grandmother   . Heart disease Paternal Grandfather     Allergies  Allergen Reactions  . Chantix [Varenicline Tartrate] Other (See Comments)    Changes mood  . Nsaids Swelling and Other (See Comments)    Also vomit Due to blood thinner  . Wellbutrin [Bupropion Hcl] Palpitations  . Asa Arthritis Strength-Antacid [Aspirin Buffered] Other (See Comments)    Due to blood thinner  . Other     cryopercipate    Medication list has been reviewed and updated.  Current Outpatient Medications on File Prior to Visit  Medication Sig Dispense Refill  . aluminum chloride (DRYSOL) 20 % external solution Apply topically at bedtime. Use on hands to reduce sweating.  Once excess sweating is controlled can use just 1-2x a week 60 mL  2  . Antihemophilic Factor, Recomb, (HELIXATE FS) 3000 units KIT every other day.    Marland Kitchen BIOTIN PO Take 1 tablet by mouth daily.    Sarajane Marek Sodium 30-100 MG CAPS Take by mouth as needed.     . citalopram (CELEXA) 40 MG tablet Take 1 tablet (40 mg total) by mouth daily. 90 tablet 3  . morphine (KADIAN) 30 MG 24 hr capsule Take 30 mg by mouth daily.    Marland Kitchen morphine (MSIR) 15 MG tablet Take 15 mg by mouth every 4 (four) hours as needed.    . Multiple Vitamin (MULTIVITAMIN) capsule Take 1 capsule by mouth daily.    . pantoprazole (PROTONIX) 40 MG tablet TAKE 1 TABLET BY MOUTH EVERY DAY 90 tablet 1   No  current facility-administered medications on file prior to visit.    Review of Systems:  As per HPI- otherwise negative.   Physical Examination: There were no vitals filed for this visit. There were no vitals filed for this visit. There is no height or weight on file to calculate BMI. Ideal Body Weight:    Spoke with patient on telephone, we were not able to get video to work.  Patient sounds well, no cough, wheezing, distress is noted.  He notes that home health nurse is visiting him weekly and checking his blood pressure and temperature  Assessment and Plan: Recurrent major depressive disorder, in partial remission ([place])  Hemophilia ([place])  Smoker - Plan: CT CHEST LUNG CA SCREEN LOW DOSE W/O CM  Encounter for screening for lung cancer - Plan: CT CHEST LUNG CA SCREEN LOW DOSE W/O CM  Virtual visit today for follow-up Taking Celexa for depression with good results His hemophilia is managed by [facility] hematology They also treat his pain with oral morphine Ordered lung cancer screening CT for him to have done later this year  asked him to see me in the office in about 6 months, or when he feels comfortable Spoke with patient for about 8 minutes today This visit occurred during the SARS-CoV-2 public health emergency.  Safety protocols were in place, including screening questions prior to the visit, additional usage of staff PPE, and extensive cleaning of exam room while observing appropriate contact time as indicated for disinfecting solutions.    Signed Lamar Blinks, MD

## 2019-03-29 NOTE — Patient Instructions (Signed)
It was good to see you again today, take care Please consider getting the shingles vaccine, called Shingrix, at your pharmacy

## 2019-03-30 ENCOUNTER — Other Ambulatory Visit: Payer: Self-pay

## 2019-03-30 ENCOUNTER — Ambulatory Visit (INDEPENDENT_AMBULATORY_CARE_PROVIDER_SITE_OTHER): Payer: Medicare HMO | Admitting: Family Medicine

## 2019-03-30 ENCOUNTER — Encounter: Payer: Self-pay | Admitting: Family Medicine

## 2019-03-30 DIAGNOSIS — F17200 Nicotine dependence, unspecified, uncomplicated: Secondary | ICD-10-CM

## 2019-03-30 DIAGNOSIS — Z122 Encounter for screening for malignant neoplasm of respiratory organs: Secondary | ICD-10-CM | POA: Diagnosis not present

## 2019-03-30 DIAGNOSIS — D66 Hereditary factor VIII deficiency: Secondary | ICD-10-CM | POA: Diagnosis not present

## 2019-03-30 DIAGNOSIS — F3341 Major depressive disorder, recurrent, in partial remission: Secondary | ICD-10-CM

## 2019-06-10 ENCOUNTER — Telehealth: Payer: Self-pay | Admitting: Family Medicine

## 2019-06-17 NOTE — Progress Notes (Signed)
Nurse connected with patient 06/18/19 at 10:00 AM EDT by a telephone enabled telemedicine application and verified that I am speaking with the correct person using two identifiers. Patient stated full name and DOB. Patient gave permission to continue with virtual visit. Patient's location was at home and Nurse's location was at [place] office.   Subjective:   Darren Torres is a 64 y.o. male who presents for an Initial Medicare Annual Wellness Visit.  Review of Systems  Home Safety/Smoke Alarms: Feels safe in home. Smoke alarms in place.  Lives w/ wife in 1 story home.   Male:   CCS-  Declines   PSA-  Lab Results  Component Value Date   PSA 0.74 12/30/2017   PSA 0.52 06/21/2016   PSA 1.04 05/30/2015      Objective:     Advanced Directives 06/18/2019  Does Patient Have a Medical Advance Directive? No  Would patient like information on creating a medical advance directive? No - Patient declined    Current Medications (verified) Outpatient Encounter Medications as of 06/18/2019  Medication Sig  . aluminum chloride (DRYSOL) 20 % external solution Apply topically at bedtime. Use on hands to reduce sweating.  Once excess sweating is controlled can use just 1-2x a week  . Antihemophilic Factor, Recomb, (HELIXATE FS) 3000 units KIT every other day.  . BIOTIN PO Take 1 tablet by mouth daily.  . Casanthranol-Docusate Sodium 30-100 MG CAPS Take by mouth as needed.   . morphine (KADIAN) 30 MG 24 hr capsule Take 30 mg by mouth daily.  . morphine (MSIR) 15 MG tablet Take 15 mg by mouth every 4 (four) hours as needed.  . Multiple Vitamin (MULTIVITAMIN) capsule Take 1 capsule by mouth daily.  . pantoprazole (PROTONIX) 40 MG tablet TAKE 1 TABLET BY MOUTH EVERY DAY  . citalopram (CELEXA) 40 MG tablet Take 1 tablet (40 mg total) by mouth daily. (Patient not taking: Reported on 06/18/2019)   No facility-administered encounter medications on file as of 06/18/2019.    Allergies (verified) Chantix  [varenicline tartrate], Nsaids, Wellbutrin [bupropion hcl], Asa arthritis strength-antacid [aspirin buffered], and Other   History: Past Medical History:  Diagnosis Date  . Chronic kidney disease   . Clotting disorder ([facility])   . Depression   . Hemophilia ([facility])   . Hepatitis C    Past Surgical History:  Procedure Laterality Date  . JOINT REPLACEMENT     Family History  Problem Relation Age of Onset  . Arthritis Mother   . Heart disease Father   . Heart disease Maternal Grandmother   . Heart disease Maternal Grandfather   . Cancer Paternal Grandmother   . Heart disease Paternal Grandfather    Social History   Socioeconomic History  . Marital status: Married    Spouse name: Not on file  . Number of children: Not on file  . Years of education: Not on file  . Highest education level: Not on file  Occupational History  . Not on file  Tobacco Use  . Smoking status: Current Every Day Smoker    Packs/day: 0.50    Types: Cigarettes  Substance and Sexual Activity  . Alcohol use: No  . Drug use: No  . Sexual activity: Not Currently  Other Topics Concern  . Not on file  Social History Narrative  . Not on file   Social Determinants of Health   Financial Resource Strain: Low Risk   . Difficulty of Paying Living Expenses: Not very hard  Food   Insecurity: No Food Insecurity  . Worried About Running Out of Food in the Last Year: Never true  . Ran Out of Food in the Last Year: Never true  Transportation Needs: No Transportation Needs  . Lack of Transportation (Medical): No  . Lack of Transportation (Non-Medical): No  Physical Activity:   . Days of Exercise per Week:   . Minutes of Exercise per Session:   Stress:   . Feeling of Stress :   Social Connections:   . Frequency of Communication with Friends and Family:   . Frequency of Social Gatherings with Friends and Family:   . Attends Religious Services:   . Active Member of Clubs or Organizations:   . Attends Club or  Organization Meetings:   . Marital Status:    Tobacco Counseling Ready to quit: No Counseling given: No   Clinical Intake: Pain : No/denies pain    Activities of Daily Living In your present state of health, do you have any difficulty performing the following activities: 06/18/2019  Hearing? N  Vision? N  Difficulty concentrating or making decisions? N  Walking or climbing stairs? N  Dressing or bathing? N  Doing errands, shopping? N  Preparing Food and eating ? N  Using the Toilet? N  In the past six months, have you accidently leaked urine? N  Do you have problems with loss of bowel control? N  Managing your Medications? N  Managing your Finances? N  Housekeeping or managing your Housekeeping? N  Some recent data might be hidden     Immunizations and Health Maintenance Immunization History  Administered Date(s) Administered  . Zoster 03/19/2010   Health Maintenance Due  Topic Date Due  . HIV Screening  Never done  . TETANUS/TDAP  Never done    Patient Care Team: Copland, Jessica C, MD as PCP - General (Family Medicine) Riaz M. Chowdhury, MD as Consulting Physician (Gastroenterology)  Indicate any recent Medical Services you may have received from other than [facility] providers in the past year (date may be approximate).    Assessment:   This is a routine wellness examination for Kendyn. Physical assessment deferred to PCP.   Hearing/Vision screen Unable to assess. This visit is enabled though telemedicine due to Covid 19.   Dietary issues and exercise activities discussed: Current Exercise Habits: The patient does not participate in regular exercise at present, Exercise limited by: None identified Diet (meal preparation, eat out, water intake, caffeinated beverages, dairy products, fruits and vegetables): in general, an "unhealthy" diet, on average, 1-2 meals per day plus snacks.      Goals    . Increase physical activity      Depression Screen PHQ 2/9  Scores 06/18/2019 12/22/2015 11/09/2013  PHQ - 2 Score 1 0 0    Fall Risk Fall Risk  06/18/2019 12/22/2015 11/09/2013  Falls in the past year? 0 No No  Number falls in past yr: 0 - -  Injury with Fall? 0 - -  Follow up Education provided;Falls prevention discussed - -     Cognitive Function:   Ad8 score reviewed for issues:  Issues making decisions:no  Less interest in hobbies / activities:no  Repeats questions, stories (family complaining):no  Trouble using ordinary gadgets (microwave, computer, phone):no  Forgets the month or year: no  Mismanaging finances: no  Remembering appts:no  Daily problems with thinking and/or memory:no Ad8 score is=0      Screening Tests Health Maintenance  Topic Date Due  . HIV Screening    Never done  . TETANUS/TDAP  Never done  . INFLUENZA VACCINE  10/18/2019  . COLONOSCOPY  04/01/2024  . Hepatitis C Screening  Completed      Plan:     Please schedule your next medicare wellness visit with me in 1 yr.  Continue to eat heart healthy diet (full of fruits, vegetables, whole grains, lean protein, water--limit salt, fat, and sugar intake) and increase physical activity as tolerated.  Continue doing brain stimulating activities (puzzles, reading, adult coloring books, staying active) to keep memory sharp.     I have personally reviewed and noted the following in the patient's chart:   . Medical and social history . Use of alcohol, tobacco or illicit drugs  . Current medications and supplements . Functional ability and status . Nutritional status . Physical activity . Advanced directives . List of other physicians . Hospitalizations, surgeries, and ER visits in previous 12 months . Vitals . Screenings to include cognitive, depression, and falls . Referrals and appointments  In addition, I have reviewed and discussed with patient certain preventive protocols, quality metrics, and best practice recommendations. A written personalized  care plan for preventive services as well as general preventive health recommendations were provided to patient.     Shela Nevin, [place]   06/18/2019

## 2019-06-18 ENCOUNTER — Encounter: Payer: Self-pay | Admitting: *Deleted

## 2019-06-18 ENCOUNTER — Other Ambulatory Visit: Payer: Self-pay

## 2019-06-18 ENCOUNTER — Ambulatory Visit (INDEPENDENT_AMBULATORY_CARE_PROVIDER_SITE_OTHER): Payer: Medicare HMO | Admitting: *Deleted

## 2019-06-18 DIAGNOSIS — Z0000 Encounter for general adult medical examination without abnormal findings: Secondary | ICD-10-CM | POA: Diagnosis not present

## 2019-06-18 NOTE — Patient Instructions (Addendum)
Please schedule your next medicare wellness visit with me in 1 yr.  Continue to eat heart healthy diet (full of fruits, vegetables, whole grains, lean protein, water--limit salt, fat, and sugar intake) and increase physical activity as tolerated.  Continue doing brain stimulating activities (puzzles, reading, adult coloring books, staying active) to keep memory sharp.    Darren Torres , Thank you for taking time to come for your Medicare Wellness Visit. I appreciate your ongoing commitment to your health goals. Please review the following plan we discussed and let me know if I can assist you in the future.   These are the goals we discussed: Goals    . Increase physical activity    . Quit Smoking       This is a list of the screening recommended for you and due dates:  Health Maintenance  Topic Date Due  . HIV Screening  Never done  . Tetanus Vaccine  Never done  . Flu Shot  10/18/2019  . Colon Cancer Screening  04/01/2024  .  Hepatitis C: One time screening is recommended by Center for Disease Control  (CDC) for  adults born from [birth year] through 1963.   Completed    Coping with Quitting Smoking  Quitting smoking is a physical and mental challenge. You will face cravings, withdrawal symptoms, and temptation. Before quitting, work with your health care provider to make a plan that can help you cope. Preparation can help you quit and keep you from giving in. How can I cope with cravings? Cravings usually last for 5-10 minutes. If you get through it, the craving will pass. Consider taking the following actions to help you cope with cravings:  Keep your mouth busy: ? Chew sugar-free gum. ? Suck on hard candies or a straw. ? Brush your teeth.  Keep your hands and body busy: ? Immediately change to a different activity when you feel a craving. ? Squeeze or play with a ball. ? Do an activity or a hobby, like making bead jewelry, practicing needlepoint, or working with wood. ? Mix up your  normal routine. ? Take a short exercise break. Go for a quick walk or run up and down stairs. ? Spend time in public places where smoking is not allowed.  Focus on doing something kind or helpful for someone else.  Call a friend or family member to talk during a craving.  Join a support group.  Call a quit line, such as 1-800-QUIT-NOW.  Talk with your health care provider about medicines that might help you cope with cravings and make quitting easier for you. How can I deal with withdrawal symptoms? Your body may experience negative effects as it tries to get used to not having nicotine in the system. These effects are called withdrawal symptoms. They may include:  Feeling hungrier than normal.  Trouble concentrating.  Irritability.  Trouble sleeping.  Feeling depressed.  Restlessness and agitation.  Craving a cigarette. To manage withdrawal symptoms:  Avoid places, people, and activities that trigger your cravings.  Remember why you want to quit.  Get plenty of sleep.  Avoid coffee and other caffeinated drinks. These may worsen some of your symptoms. How can I handle social situations? Social situations can be difficult when you are quitting smoking, especially in the first few weeks. To manage this, you can:  Avoid parties, bars, and other social situations where people might be smoking.  Avoid alcohol.  Leave right away if you have the urge to smoke.  Explain to your family and friends that you are quitting smoking. Ask for understanding and support.  Plan activities with friends or family where smoking is not an option. What are some ways I can cope with stress? Wanting to smoke may cause stress, and stress can make you want to smoke. Find ways to manage your stress. Relaxation techniques can help. For example:  Breathe slowly and deeply, in through your nose and out through your mouth.  Listen to soothing, relaxing music.  Talk with a family member or  friend about your stress.  Light a candle.  Soak in a bath or take a shower.  Think about a peaceful place. What are some ways I can prevent weight gain? Be aware that many people gain weight after they quit smoking. However, not everyone does. To keep from gaining weight, have a plan in place before you quit and stick to the plan after you quit. Your plan should include:  Having healthy snacks. When you have a craving, it may help to: ? Eat plain popcorn, crunchy carrots, celery, or other cut vegetables. ? Chew sugar-free gum.  Changing how you eat: ? Eat small portion sizes at meals. ? Eat 4-6 small meals throughout the day instead of 1-2 large meals a day. ? Be mindful when you eat. Do not watch television or do other things that might distract you as you eat.  Exercising regularly: ? Make time to exercise each day. If you do not have time for a long workout, do short bouts of exercise for 5-10 minutes several times a day. ? Do some form of strengthening exercise, like weight lifting, and some form of aerobic exercise, like running or swimming.  Drinking plenty of water or other low-calorie or no-calorie drinks. Drink 6-8 glasses of water daily, or as much as instructed by your health care provider. Summary  Quitting smoking is a physical and mental challenge. You will face cravings, withdrawal symptoms, and temptation to smoke again. Preparation can help you as you go through these challenges.  You can cope with cravings by keeping your mouth busy (such as by chewing gum), keeping your body and hands busy, and making calls to family, friends, or a helpline for people who want to quit smoking.  You can cope with withdrawal symptoms by avoiding places where people smoke, avoiding drinks with caffeine, and getting plenty of rest.  Ask your health care provider about the different ways to prevent weight gain, avoid stress, and handle social situations. This information is not  intended to replace advice given to you by your health care provider. Make sure you discuss any questions you have with your health care provider. Document Revised: 02/15/2017 Document Reviewed: 03/02/2016 Elsevier Patient Education  2020 Elsevier Inc.  Preventive Care 58-93 Years Old, Male Preventive care refers to lifestyle choices and visits with your health care provider that can promote health and wellness. This includes:  A yearly physical exam. This is also called an annual well check.  Regular dental and eye exams.  Immunizations.  Screening for certain conditions.  Healthy lifestyle choices, such as eating a healthy diet, getting regular exercise, not using drugs or products that contain nicotine and tobacco, and limiting alcohol use. What can I expect for my preventive care visit? Physical exam Your health care provider will check:  Height and weight. These may be used to calculate body mass index (BMI), which is a measurement that tells if you are at a healthy weight.  Heart  rate and blood pressure.  Your skin for abnormal spots. Counseling Your health care provider may ask you questions about:  Alcohol, tobacco, and drug use.  Emotional well-being.  Home and relationship well-being.  Sexual activity.  Eating habits.  Work and work Statistician. What immunizations do I need?  Influenza (flu) vaccine  This is recommended every year. Tetanus, diphtheria, and pertussis (Tdap) vaccine  You may need a Td booster every 10 years. Varicella (chickenpox) vaccine  You may need this vaccine if you have not already been vaccinated. Zoster (shingles) vaccine  You may need this after age 69. Measles, mumps, and rubella (MMR) vaccine  You may need at least one dose of MMR if you were born in 1957 or later. You may also need a second dose. Pneumococcal conjugate (PCV13) vaccine  You may need this if you have certain conditions and were not previously  vaccinated. Pneumococcal polysaccharide (PPSV23) vaccine  You may need one or two doses if you smoke cigarettes or if you have certain conditions. Meningococcal conjugate (MenACWY) vaccine  You may need this if you have certain conditions. Hepatitis A vaccine  You may need this if you have certain conditions or if you travel or work in places where you may be exposed to hepatitis A. Hepatitis B vaccine  You may need this if you have certain conditions or if you travel or work in places where you may be exposed to hepatitis B. Haemophilus influenzae type b (Hib) vaccine  You may need this if you have certain risk factors. Human papillomavirus (HPV) vaccine  If recommended by your health care provider, you may need three doses over 6 months. You may receive vaccines as individual doses or as more than one vaccine together in one shot (combination vaccines). Talk with your health care provider about the risks and benefits of combination vaccines. What tests do I need? Blood tests  Lipid and cholesterol levels. These may be checked every 5 years, or more frequently if you are over 70 years old.  Hepatitis C test.  Hepatitis B test. Screening  Lung cancer screening. You may have this screening every year starting at age 28 if you have a 30-pack-year history of smoking and currently smoke or have quit within the past 15 years.  Prostate cancer screening. Recommendations will vary depending on your family history and other risks.  Colorectal cancer screening. All adults should have this screening starting at age 14 and continuing until age 52. Your health care provider may recommend screening at age 47 if you are at increased risk. You will have tests every 1-10 years, depending on your results and the type of screening test.  Diabetes screening. This is done by checking your blood sugar (glucose) after you have not eaten for a while (fasting). You may have this done every 1-3  years.  Sexually transmitted disease (STD) testing. Follow these instructions at home: Eating and drinking  Eat a diet that includes fresh fruits and vegetables, whole grains, lean protein, and low-fat dairy products.  Take vitamin and mineral supplements as recommended by your health care provider.  Do not drink alcohol if your health care provider tells you not to drink.  If you drink alcohol: ? Limit how much you have to 0-2 drinks a day. ? Be aware of how much alcohol is in your drink. In the [place] one drink equals one 12 oz bottle of beer (355 mL), one 5 oz glass of wine (148 mL), or one 1 oz  glass of hard liquor (44 mL). Lifestyle  Take daily care of your teeth and gums.  Stay active. Exercise for at least 30 minutes on 5 or more days each week.  Do not use any products that contain nicotine or tobacco, such as cigarettes, e-cigarettes, and chewing tobacco. If you need help quitting, ask your health care provider.  If you are sexually active, practice safe sex. Use a condom or other form of protection to prevent STIs (sexually transmitted infections).  Talk with your health care provider about taking a low-dose aspirin every day starting at age 58. What's next?  Go to your health care provider once a year for a well check visit.  Ask your health care provider how often you should have your eyes and teeth checked.  Stay up to date on all vaccines. This information is not intended to replace advice given to you by your health care provider. Make sure you discuss any questions you have with your health care provider. Document Revised: 02/27/2018 Document Reviewed: 02/27/2018 Elsevier Patient Education  2020 [employer].

## 2019-08-12 ENCOUNTER — Telehealth: Payer: Self-pay | Admitting: Family Medicine

## 2019-08-12 ENCOUNTER — Other Ambulatory Visit: Payer: Self-pay | Admitting: Family Medicine

## 2019-08-12 DIAGNOSIS — K219 Gastro-esophageal reflux disease without esophagitis: Secondary | ICD-10-CM

## 2019-08-12 MED ORDER — PANTOPRAZOLE SODIUM 40 MG PO TBEC: 40.0000 mg | DELAYED_RELEASE_TABLET | Freq: Every day | ORAL | 3 refills | Status: DC

## 2019-08-12 NOTE — Telephone Encounter (Signed)
Called him back- I rx Protonix for him He was upset that he could not pick up his extended release morphine at [facility].  Wanted me to transfer this prescription to the med center His morphine is actually prescribed by [facility].  Looking back in the notes, it looks like this prescription is actually now ready to be picked [facility] at [facility].  [facility] called [facility] and confirmed this Will call patient let him know.  If you would like for [facility] to transfer his future prescriptions to the medcenter that is fine, however otherwise I would like to defer this medication to [facility]

## 2019-08-12 NOTE — Telephone Encounter (Signed)
Caller; Dak[place] Call Back # [phone]  Patient states that he has questions about his prescriptions. Requesting a call back from provider or staff.

## 2019-08-12 NOTE — Telephone Encounter (Signed)
Patient called requesting refill on pantoprazole. Also would like refill on Morphine. Patient states he was trying to get in touch with [facility] and has been unsuccessful. Patient is a hemophiliac and has severe arthritis. He would like these medications to be sent downstairs to our med center pharmacy If appropriate.

## 2019-08-14 MED FILL — MORPHINE SULF ER 30 MG TAB: 30 | 30 days supply | Qty: 60 | Fill #0

## 2019-08-14 MED FILL — PANTOPRAZOLE SOD DR 40 MG T: 40 | 90 days supply | Qty: 90 | Fill #0

## 2019-09-11 MED FILL — MORPHINE SULF ER 30 MG TAB: 30 | 30 days supply | Qty: 60 | Fill #0

## 2019-10-09 MED FILL — MORPHINE SULF ER 30 MG TAB: 30 | 30 days supply | Qty: 60 | Fill #0

## 2019-11-10 MED FILL — MORPHINE SULF ER 30 MG TAB: 30 | 30 days supply | Qty: 60 | Fill #0

## 2019-12-02 MED FILL — PANTOPRAZOLE SOD DR 40 MG T: 40 | 90 days supply | Qty: 90 | Fill #1

## 2019-12-10 ENCOUNTER — Ambulatory Visit: Payer: Medicare HMO | Admitting: Family Medicine

## 2019-12-10 NOTE — Progress Notes (Deleted)
[place] at [facility] [address], [place], [place] [address] [phone]  Date:  12/10/2019   Name:  Darren Torres   DOB:  31-May-1955   MRN:  [id number]  PCP:  Darreld Mclean, MD    Chief Complaint: No chief complaint on file.   History of Present Illness:  Darren Torres is a 65 y.o. very pleasant male patient who presents with the following:  History of hemophilia, chronic pain due to joint disease from hemophilia, hepatitis C status post curative treatment, cirrhosis, chronic kidney disease Patient here today for 84-month follow-up visit Last seen by myself in January of this year-virtual visit-from that visit: Taking Celexa for depression with good results His hemophilia is managed by [facility] hematology They also treat his pain with oral morphine Ordered lung cancer screening CT for him to have done later this year  asked him to see me in the office in about 6 months, or when he feels comfortable  Tetanus Flu vaccine COVID-19 series Most recent labs from May 2020  Per most recent hematology note, November: Severe Factor VIII deficient hemophilia A -did not respond well to Eloctate (post infusion levels were sub-optimal, subjective increased bleeding reports) -Had rash with Adynovate (unclear if this was related or just happened to coincide, but patient desired siwtch) -Changed to [facility] in June and joined TAURUS study on 09/14/15. 3000-3500 units q3 days and prn for bleeding.  -Changed to Kogenate in July 2018 with 3k q3 days, in 2019 changed increase frequency to q2 days. [facility] with [facility] and is happy here  Declines Hemlibra   Patient Active Problem List   Diagnosis Date Noted  . Hepatic cirrhosis ([place]) 05/30/2015  . Cataract 04/28/2012  . DJD (degenerative joint disease) 04/28/2012  . Abnormality of gait 04/28/2012  . Hemophilia ([place]) 05/28/2011  . Hepatitis C 05/28/2011  . Chronic  pain 05/28/2011  . Depression 05/28/2011    Past Medical History:  Diagnosis Date  . Chronic kidney disease   . Clotting disorder ([place])   . Depression   . Hemophilia ([place])   . Hepatitis C     Past Surgical History:  Procedure Laterality Date  . JOINT REPLACEMENT      Social History   Tobacco Use  . Smoking status: Current Every Day Smoker    Packs/day: 0.50    Types: Cigarettes  Substance Use Topics  . Alcohol use: No  . Drug use: No    Family History  Problem Relation Age of Onset  . Arthritis Mother   . Heart disease Father   . Heart disease Maternal Grandmother   . Heart disease Maternal Grandfather   . Cancer Paternal Grandmother   . Heart disease Paternal Grandfather     Allergies  Allergen Reactions  . Chantix [Varenicline Tartrate] Other (See Comments)    Changes mood  . Nsaids Swelling and Other (See Comments)    Also vomit Due to blood thinner  . Wellbutrin [Bupropion Hcl] Palpitations  . Asa Arthritis Strength-Antacid [Aspirin Buffered] Other (See Comments)    Due to blood thinner  . Other     cryopercipate    Medication list has been reviewed and updated.  Current Outpatient Medications on File Prior to Visit  Medication Sig Dispense Refill  . aluminum chloride (DRYSOL) 20 % external solution Apply topically at bedtime. Use on hands to reduce sweating.  Once excess sweating is controlled can use  just 1-2x a week 60 mL 2  . Antihemophilic Factor, Recomb, (HELIXATE FS) 3000 units KIT every other day.    Marland Kitchen BIOTIN PO Take 1 tablet by mouth daily.    Sarajane Marek Sodium 30-100 MG CAPS Take by mouth as needed.     . citalopram (CELEXA) 40 MG tablet Take 1 tablet (40 mg total) by mouth daily. (Patient not taking: Reported on 06/18/2019) 90 tablet 3  . morphine (KADIAN) 30 MG 24 hr capsule Take 30 mg by mouth daily.    Marland Kitchen morphine (MSIR) 15 MG tablet Take 15 mg by mouth every 4 (four) hours as needed.    . Multiple Vitamin (MULTIVITAMIN)  capsule Take 1 capsule by mouth daily.    . pantoprazole (PROTONIX) 40 MG tablet Take 1 tablet (40 mg total) by mouth daily. 90 tablet 3   No current facility-administered medications on file prior to visit.    Review of Systems:  As per HPI- otherwise negative.   Physical Examination: There were no vitals filed for this visit. There were no vitals filed for this visit. There is no height or weight on file to calculate BMI. Ideal Body Weight:    GEN: no acute distress. HEENT: Atraumatic, Normocephalic.  Ears and Nose: No external deformity. CV: RRR, No M/G/R. No JVD. No thrill. No extra heart sounds. PULM: CTA B, no wheezes, crackles, rhonchi. No retractions. No resp. distress. No accessory muscle use. ABD: S, NT, ND, +BS. No rebound. No HSM. EXTR: No c/c/e PSYCH: Normally interactive. Conversant.    Assessment and Plan: *** This visit occurred during the SARS-CoV-2 public health emergency.  Safety protocols were in place, including screening questions prior to the visit, additional usage of staff PPE, and extensive cleaning of exam room while observing appropriate contact time as indicated for disinfecting solutions.    Signed Lamar Blinks, MD

## 2019-12-15 MED FILL — MORPHINE SULF ER 15 MG TAB: 15 | 30 days supply | Qty: 30 | Fill #0

## 2019-12-15 MED FILL — MORPHINE SULF ER 30 MG TAB: 30 | 30 days supply | Qty: 60 | Fill #0

## 2019-12-16 ENCOUNTER — Other Ambulatory Visit: Payer: Self-pay | Admitting: Family Medicine

## 2019-12-16 ENCOUNTER — Other Ambulatory Visit: Payer: Self-pay

## 2019-12-16 ENCOUNTER — Encounter: Payer: Self-pay | Admitting: Family Medicine

## 2019-12-16 ENCOUNTER — Telehealth (INDEPENDENT_AMBULATORY_CARE_PROVIDER_SITE_OTHER): Payer: Medicare HMO | Admitting: Family Medicine

## 2019-12-16 DIAGNOSIS — R630 Anorexia: Secondary | ICD-10-CM | POA: Diagnosis not present

## 2019-12-16 MED ORDER — MIRTAZAPINE 15 MG PO TABS: 15.0000 mg | ORAL_TABLET | Freq: Every day | ORAL | 3 refills | Status: DC

## 2019-12-16 MED FILL — MIRTAZAPINE 15 MG TABLET: 15 | 30 days supply | Qty: 30 | Fill #0

## 2019-12-16 NOTE — Progress Notes (Signed)
[place] at [facility] [address] [place], [place] [address] [phone]  Date:  12/16/2019   Name:  Darren Torres   DOB:  [birth year]   MRN:  [id number]  PCP:  Darreld Mclean, MD    Chief Complaint: No chief complaint on file.   History of Present Illness:  Darren Torres is a 64 y.o. very pleasant male patient who presents with the following:  Virtual visit today for 79-month follow-up- History of hemophilia, chronic pain due to joint disease from hemophilia, hepatitis C status post curative treatment, cirrhosis, chronic kidney disease  Patient location is home, provider location is office Patient identity confirmed with 2 factors, he gives consent for virtual visit today Connected with pt via phone today- we could not get video to work   I last saw Darren Torres in January of this year-at that time he was taking Celexa for depression with good results [facility] hematology helps manage his hemophilia, they also prescribe oral morphine for pain Labs on chart from May 2020- ?  Is he getting labs through [facility] It looks like he is also seeing them virtually, most recent in person visit November 2020  He has noted some issues with abd aches and a sensation of bloating- he has noted this for 3 months or more It is intermittent.  No vomiting Seems to be at the location where he had his colectomy operation  He has noted a little diarrhea for just one day. Then back to normal No blood in his stool or urine  No bulge or tenderness to palpation noted   His joint pains are stable - he has aches with change in weather He has lost 5-8 lbs unintetionally His appetite is not great.   He is no longer taking celexa Wonders about taking remember for appetite stimulation- taken in the past   Patient Active Problem List   Diagnosis Date Noted  . Hepatic cirrhosis ([place]) 05/30/2015  . Cataract 04/28/2012  . DJD (degenerative joint  disease) 04/28/2012  . Abnormality of gait 04/28/2012  . Hemophilia ([place]) 05/28/2011  . Hepatitis C 05/28/2011  . Chronic pain 05/28/2011  . Depression 05/28/2011    Past Medical History:  Diagnosis Date  . Chronic kidney disease   . Clotting disorder ([place])   . Depression   . Hemophilia ([place])   . Hepatitis C     Past Surgical History:  Procedure Laterality Date  . JOINT REPLACEMENT      Social History   Tobacco Use  . Smoking status: Current Every Day Smoker    Packs/day: 0.50    Types: Cigarettes  Substance Use Topics  . Alcohol use: No  . Drug use: No    Family History  Problem Relation Age of Onset  . Arthritis Mother   . Heart disease Father   . Heart disease Maternal Grandmother   . Heart disease Maternal Grandfather   . Cancer Paternal Grandmother   . Heart disease Paternal Grandfather     Allergies  Allergen Reactions  . Chantix [Varenicline Tartrate] Other (See Comments)    Changes mood  . Nsaids Swelling and Other (See Comments)    Also vomit Due to blood thinner  . Wellbutrin [Bupropion Hcl] Palpitations  . Asa Arthritis Strength-Antacid [Aspirin Buffered] Other (See Comments)    Due to blood thinner  . Other     cryopercipate    Medication list has been reviewed and updated.  Current Outpatient Medications on File Prior to Visit  Medication Sig Dispense Refill  . aluminum chloride (DRYSOL) 20 % external solution Apply topically at bedtime. Use on hands to reduce sweating.  Once excess sweating is controlled can use just 1-2x a week 60 mL 2  . Antihemophilic Factor, Recomb, (HELIXATE FS) 3000 units KIT every other day.    Marland Kitchen BIOTIN PO Take 1 tablet by mouth daily.    Sarajane Marek Sodium 30-100 MG CAPS Take by mouth as needed.     . citalopram (CELEXA) 40 MG tablet Take 1 tablet (40 mg total) by mouth daily. (Patient not taking: Reported on 06/18/2019) 90 tablet 3  . morphine (KADIAN) 30 MG 24 hr capsule Take 30 mg by mouth daily.     Marland Kitchen morphine (MSIR) 15 MG tablet Take 15 mg by mouth every 4 (four) hours as needed.    . Multiple Vitamin (MULTIVITAMIN) capsule Take 1 capsule by mouth daily.    . pantoprazole (PROTONIX) 40 MG tablet Take 1 tablet (40 mg total) by mouth daily. 90 tablet 3   No current facility-administered medications on file prior to visit.    Review of Systems:  As per HPI- otherwise negative.   Physical Examination: There were no vitals filed for this visit. There were no vitals filed for this visit. There is no height or weight on file to calculate BMI. Ideal Body Weight:    Pt sounds well over the phone He reports weekly vitals with home health nurse - normal per his report   Assessment and Plan: Appetite loss - Plan: mirtazapine (REMERON) 15 MG tablet  Spoke with pt on the phone today for about 8 minutes Hemophilia is managed by [place] He notes decreased appetite- will start on remeron now He notes intermittent abd discomfort about about 3 months Agrees to see me in person next week to look at this further He will seek care if worsening in the meantime   Signed Lamar Blinks, MD

## 2019-12-21 NOTE — Progress Notes (Addendum)
[place] at [facility] [address], [place], [place] [address] [phone]  Date:  12/23/2019   Name:  Darren Torres   DOB:  Jul 03, 1955   MRN:  [id number]  PCP:  Darreld Mclean, MD    Chief Complaint: Anorexia   History of Present Illness:  Darren Torres is a 64 y.o. very pleasant male patient who presents with the following:  Patient here today for in person follow-up History of hepatitis C status post curative treatment, hepatic cirrhosis, hemophilia, chronic pain and associated degenerative joint disease, depression, chronic kidney disease We had a virtual visit on September 29: He has noted some issues with abd aches and a sensation of bloating- he has noted this for 3 months or more It is intermittent.  No vomiting Seems to be at the location where he had his colectomy operation- In January 2015 he had spontaneous mesenteric hematoma causing ischemic ileum and cecum and is status post exploratory laparotomy with ileocecectomy and lysis of adhesions at [facility].   He has noted a little diarrhea for just one day. Then back to normal No blood in his stool or urine  No bulge or tenderness to palpation noted   I prescribed mirtazapine as an appetite stimulant, which patient had taken in the past, arrange for him to come in for an in person visit today He just started taking this so he is not sure if it's helping yet or not Eating might seem to make his symptoms worse but not always Not constipated at all  He has lost 10 to 15 pounds over the last 2 years-patient feels his weight today is higher than true weight because he is wearing full clothing  Wt Readings from Last 3 Encounters:  12/23/19 133 lb (60.3 kg)  09/24/18 135 lb (61.2 kg)  12/30/17 145 lb (65.8 kg)   Colonoscopy 2016- ? Might not be accurate. Pt thinks this was actually done in 2009, he reports he has refused to do repeat screening Flu  vaccine- declines today COVID-19 vaccine- pt declines today  Can offer routine labs today     Patient Active Problem List   Diagnosis Date Noted  . Hepatic cirrhosis ([place]) 05/30/2015  . Cataract 04/28/2012  . DJD (degenerative joint disease) 04/28/2012  . Abnormality of gait 04/28/2012  . Hemophilia ([place]) 05/28/2011  . Hepatitis C 05/28/2011  . Chronic pain 05/28/2011  . Depression 05/28/2011    Past Medical History:  Diagnosis Date  . Chronic kidney disease   . Clotting disorder ([place])   . Depression   . Hemophilia ([place])   . Hepatitis C     Past Surgical History:  Procedure Laterality Date  . JOINT REPLACEMENT      Social History   Tobacco Use  . Smoking status: Current Every Day Smoker    Packs/day: 0.50    Types: Cigarettes  . Smokeless tobacco: Never Used  Substance Use Topics  . Alcohol use: No  . Drug use: No    Family History  Problem Relation Age of Onset  . Arthritis Mother   . Heart disease Father   . Heart disease Maternal Grandmother   . Heart disease Maternal Grandfather   . Cancer Paternal Grandmother   . Heart disease Paternal Grandfather     Allergies  Allergen Reactions  . Chantix [Varenicline Tartrate] Other (See Comments)    Changes mood  . Nsaids Swelling and Other (See Comments)  Also vomit Due to blood thinner  . Wellbutrin [Bupropion Hcl] Palpitations  . Asa Arthritis Strength-Antacid [Aspirin Buffered] Other (See Comments)    Due to blood thinner  . Other     cryopercipate    Medication list has been reviewed and updated.  Current Outpatient Medications on File Prior to Visit  Medication Sig Dispense Refill  . aluminum chloride (DRYSOL) 20 % external solution Apply topically at bedtime. Use on hands to reduce sweating.  Once excess sweating is controlled can use just 1-2x a week 60 mL 2  . Antihemophilic Factor, Recomb, (HELIXATE FS) 3000 units KIT every other day.    Marland Kitchen BIOTIN PO Take 1 tablet by mouth daily.    Sarajane Marek Sodium 30-100 MG CAPS Take by mouth as needed.     . mirtazapine (REMERON) 15 MG tablet Take 1 tablet (15 mg total) by mouth at bedtime. 30 tablet 3  . morphine (KADIAN) 30 MG 24 hr capsule Take 30 mg by mouth daily.    Marland Kitchen morphine (MSIR) 15 MG tablet Take 15 mg by mouth every 4 (four) hours as needed.    . Multiple Vitamin (MULTIVITAMIN) capsule Take 1 capsule by mouth daily.    . pantoprazole (PROTONIX) 40 MG tablet Take 1 tablet (40 mg total) by mouth daily. 90 tablet 3   No current facility-administered medications on file prior to visit.    Review of Systems:  As per HPI- otherwise negative.   Physical Examination: Vitals:   12/23/19 1123  BP: 136/70  Pulse: 77  Resp: 15  SpO2: 98%   Vitals:   12/23/19 1123  Weight: 133 lb (60.3 kg)  Height: '5\' 10"'  (1.778 m)   Body mass index is 19.08 kg/m. Ideal Body Weight: Weight in (lb) to have BMI = 25: 173.9  GEN: no acute distress.  Has lost some weight, otherwise appears well and his normal self HEENT: Atraumatic, Normocephalic.  Ears and Nose: No external deformity. CV: RRR, No M/G/R. No JVD. No thrill. No extra heart sounds. PULM: CTA B, no wheezes, crackles, rhonchi. No retractions. No resp. distress. No accessory muscle use. ABD: S, NT, ND, +BS. No rebound. No HSM.  Midline surgical scar is healed, otherwise belly is benign EXTR: No c/c/e PSYCH: Normally interactive. Conversant.    Assessment and Plan: Generalized abdominal pain  Hemophilia ([place]) - Plan: CBC  Appetite loss  Cirrhosis of liver without ascites, unspecified hepatic cirrhosis type ([place]) - Plan: Comprehensive metabolic panel  Screening for hyperlipidemia - Plan: Lipid panel  Screening for prostate cancer - Plan: PSA     Patient today with concern of abdominal pain, lack of appetite and weight loss I recommended that we obtain a CT scan to rule out mass or other dangerous cause of his symptoms Patient states that he is unable  to wear a mask as required due to current pandemic for any imaging procedure He also states that COVID-19 vaccines actually cause people to transmit Covid to others, and states that he is unwilling to be vaccinated. He states he would "rather die at home"  than wear a mask for further evaluation of his current concern abdominal symptoms.  As such, I will obtain blood work as above and leave any further evaluation up to the patient This visit occurred during the SARS-CoV-2 public health emergency.  Safety protocols were in place, including screening questions prior to the visit, additional usage of staff PPE, and extensive cleaning of exam room while observing appropriate contact  time as indicated for disinfecting solutions.    Signed Lamar Blinks, MD  Received labs as below 10/7, letter to patient Results for orders placed or performed in visit on 12/23/19  Comprehensive metabolic panel  Result Value Ref Range   Glucose, Bld 91 65 - 99 mg/dL   BUN 11 7 - 25 mg/dL   Creat 0.74 0.70 - 1.25 mg/dL   BUN/Creatinine Ratio NOT APPLICABLE 6 - 22 (calc)   Sodium 142 135 - 146 mmol/L   Potassium 3.9 3.5 - 5.3 mmol/L   Chloride 105 98 - 110 mmol/L   CO2 27 20 - 32 mmol/L   Calcium 9.5 8.6 - 10.3 mg/dL   Total Protein 7.0 6.1 - 8.1 g/dL   Albumin 4.3 3.6 - 5.1 g/dL   Globulin 2.7 1.9 - 3.7 g/dL (calc)   AG Ratio 1.6 1.0 - 2.5 (calc)   Total Bilirubin 0.3 0.2 - 1.2 mg/dL   Alkaline phosphatase (APISO) 103 35 - 144 U/L   AST 19 10 - 35 U/L   ALT 8 (L) 9 - 46 U/L  CBC  Result Value Ref Range   WBC 4.8 3.8 - 10.8 Thousand/uL   RBC 4.55 4.20 - 5.80 Million/uL   Hemoglobin 14.0 13.2 - 17.1 g/dL   HCT 42.8 38 - 50 %   MCV 94.1 80.0 - 100.0 fL   MCH 30.8 27.0 - 33.0 pg   MCHC 32.7 32.0 - 36.0 g/dL   RDW 12.1 11.0 - 15.0 %   Platelets 191 140 - 400 Thousand/uL   MPV 11.2 7.5 - 12.5 fL  PSA  Result Value Ref Range   PSA 0.58 < OR = 4.0 ng/mL  Lipid panel  Result Value Ref Range    Cholesterol 174 <200 mg/dL   HDL 48 > OR = 40 mg/dL   Triglycerides 98 <150 mg/dL   LDL Cholesterol (Calc) 107 (H) mg/dL (calc)   Total CHOL/HDL Ratio 3.6 <5.0 (calc)   Non-HDL Cholesterol (Calc) 126 <130 mg/dL (calc)

## 2019-12-21 NOTE — Patient Instructions (Addendum)
It was great to see you again today! I will be in touch with your labs asap  Please let me know if you change your mind about getting a CT scan of your abdomen to evaluate your pain and weight loss

## 2019-12-23 ENCOUNTER — Other Ambulatory Visit: Payer: Self-pay

## 2019-12-23 ENCOUNTER — Ambulatory Visit (INDEPENDENT_AMBULATORY_CARE_PROVIDER_SITE_OTHER): Payer: Medicare HMO | Admitting: Family Medicine

## 2019-12-23 ENCOUNTER — Encounter: Payer: Self-pay | Admitting: Family Medicine

## 2019-12-23 VITALS — BP 136/70 | HR 77 | Resp 15 | Ht 70.0 in | Wt 133.0 lb

## 2019-12-23 DIAGNOSIS — Z13220 Encounter for screening for lipoid disorders: Secondary | ICD-10-CM

## 2019-12-23 DIAGNOSIS — K7460 Unspecified cirrhosis of liver: Secondary | ICD-10-CM

## 2019-12-23 DIAGNOSIS — R1084 Generalized abdominal pain: Secondary | ICD-10-CM

## 2019-12-23 DIAGNOSIS — R630 Anorexia: Secondary | ICD-10-CM

## 2019-12-23 DIAGNOSIS — D66 Hereditary factor VIII deficiency: Secondary | ICD-10-CM | POA: Diagnosis not present

## 2019-12-23 DIAGNOSIS — Z125 Encounter for screening for malignant neoplasm of prostate: Secondary | ICD-10-CM

## 2019-12-23 DIAGNOSIS — E785 Hyperlipidemia, unspecified: Secondary | ICD-10-CM | POA: Diagnosis not present

## 2019-12-24 LAB — COMPREHENSIVE METABOLIC PANEL
AG Ratio: 1.6 (calc) (ref 1.0–2.5)
ALT: 8 U/L — ABNORMAL LOW (ref 9–46)
AST: 19 U/L (ref 10–35)
Albumin: 4.3 g/dL (ref 3.6–5.1)
Alkaline phosphatase (APISO): 103 U/L (ref 35–144)
BUN: 11 mg/dL (ref 7–25)
CO2: 27 mmol/L (ref 20–32)
Calcium: 9.5 mg/dL (ref 8.6–10.3)
Chloride: 105 mmol/L (ref 98–110)
Creat: 0.74 mg/dL (ref 0.70–1.25)
Globulin: 2.7 g/dL (calc) (ref 1.9–3.7)
Glucose, Bld: 91 mg/dL (ref 65–99)
Potassium: 3.9 mmol/L (ref 3.5–5.3)
Sodium: 142 mmol/L (ref 135–146)
Total Bilirubin: 0.3 mg/dL (ref 0.2–1.2)
Total Protein: 7 g/dL (ref 6.1–8.1)

## 2019-12-24 LAB — CBC
HCT: 42.8 % (ref 38.5–50.0)
Hemoglobin: 14 g/dL (ref 13.2–17.1)
MCH: 30.8 pg (ref 27.0–33.0)
MCHC: 32.7 g/dL (ref 32.0–36.0)
MCV: 94.1 fL (ref 80.0–100.0)
MPV: 11.2 fL (ref 7.5–12.5)
Platelets: 191 10*3/uL (ref 140–400)
RBC: 4.55 10*6/uL (ref 4.20–5.80)
RDW: 12.1 % (ref 11.0–15.0)
WBC: 4.8 10*3/uL (ref 3.8–10.8)

## 2019-12-24 LAB — LIPID PANEL
Cholesterol: 174 mg/dL (ref <200)
HDL: 48 mg/dL (ref >=40)
LDL Cholesterol (Calc): 107 mg/dL (calc) — ABNORMAL HIGH
Non-HDL Cholesterol (Calc): 126 mg/dL (calc) (ref <130)
Total CHOL/HDL Ratio: 3.6 (calc) (ref <5.0)
Triglycerides: 98 mg/dL (ref <150)

## 2019-12-24 LAB — PSA: PSA: 0.58 ng/mL (ref <=4.0)

## 2020-01-11 ENCOUNTER — Other Ambulatory Visit (HOSPITAL_BASED_OUTPATIENT_CLINIC_OR_DEPARTMENT_OTHER): Payer: Self-pay | Admitting: Hematology & Oncology

## 2020-01-11 MED FILL — MORPHINE SULF ER 30 MG TAB: 30 | 30 days supply | Qty: 60 | Fill #0

## 2020-01-14 MED FILL — MIRTAZAPINE 15 MG TABLET: 15 | 30 days supply | Qty: 30 | Fill #1

## 2020-01-27 DIAGNOSIS — D66 Hereditary factor VIII deficiency: Secondary | ICD-10-CM | POA: Diagnosis not present

## 2020-01-27 DIAGNOSIS — B1920 Unspecified viral hepatitis C without hepatic coma: Secondary | ICD-10-CM | POA: Diagnosis not present

## 2020-01-27 DIAGNOSIS — M362 Hemophilic arthropathy: Secondary | ICD-10-CM | POA: Diagnosis not present

## 2020-02-08 ENCOUNTER — Other Ambulatory Visit (HOSPITAL_BASED_OUTPATIENT_CLINIC_OR_DEPARTMENT_OTHER): Payer: Self-pay | Admitting: Hematology & Oncology

## 2020-02-08 MED FILL — MORPHINE SULF ER 30 MG TAB: 30 | 30 days supply | Qty: 60 | Fill #0

## 2020-02-10 MED FILL — MIRTAZAPINE 15 MG TABLET: 15 | 30 days supply | Qty: 30 | Fill #2

## 2020-02-15 ENCOUNTER — Telehealth: Payer: Self-pay | Admitting: Family Medicine

## 2020-02-15 NOTE — Telephone Encounter (Signed)
Great news.

## 2020-02-15 NOTE — Telephone Encounter (Signed)
Patient called to informed you he has gained about 10 lb since he started the weight gain medication.

## 2020-02-15 NOTE — Telephone Encounter (Signed)
Patient states he received a no show fee for 12/10/19. Patient states that appointment seem to be a mistake. He states appointment was supposed to be virtually.

## 2020-02-23 NOTE — Telephone Encounter (Signed)
I have sent an email to [facility] asking them to remove the No Show fee for DOS 12/10/19.

## 2020-03-02 ENCOUNTER — Other Ambulatory Visit (HOSPITAL_BASED_OUTPATIENT_CLINIC_OR_DEPARTMENT_OTHER): Payer: Self-pay | Admitting: Hematology & Oncology

## 2020-03-08 MED FILL — MORPHINE SULF ER 15 MG TAB: 15 | 30 days supply | Qty: 30 | Fill #0

## 2020-03-08 MED FILL — MIRTAZAPINE 15 MG TABLET: 15 | 30 days supply | Qty: 30 | Fill #3

## 2020-03-08 MED FILL — PANTOPRAZOLE SOD DR 40 MG T: 40 | 90 days supply | Qty: 90 | Fill #2

## 2020-03-08 MED FILL — MORPHINE SULF ER 30 MG TAB: 30 | 30 days supply | Qty: 60 | Fill #0

## 2020-04-14 ENCOUNTER — Other Ambulatory Visit: Payer: Self-pay | Admitting: Family Medicine

## 2020-04-14 DIAGNOSIS — R630 Anorexia: Secondary | ICD-10-CM

## 2020-04-14 MED FILL — MIRTAZAPINE 15 MG TABLET: 15 | 30 days supply | Qty: 30 | Fill #0

## 2020-04-14 MED FILL — MORPHINE SULF ER 30 MG TAB: 30 | 30 days supply | Qty: 60 | Fill #0

## 2020-04-14 NOTE — Telephone Encounter (Signed)
Pt wife came in office stating pt is needing his meds ASAP. Please advise, pt tel [phone] or [phone].

## 2020-04-27 DIAGNOSIS — Z95828 Presence of other vascular implants and grafts: Secondary | ICD-10-CM | POA: Diagnosis not present

## 2020-04-27 DIAGNOSIS — B182 Chronic viral hepatitis C: Secondary | ICD-10-CM | POA: Diagnosis not present

## 2020-04-27 DIAGNOSIS — D66 Hereditary factor VIII deficiency: Secondary | ICD-10-CM | POA: Diagnosis not present

## 2020-04-27 DIAGNOSIS — M362 Hemophilic arthropathy: Secondary | ICD-10-CM | POA: Diagnosis not present

## 2020-04-27 DIAGNOSIS — Z79899 Other long term (current) drug therapy: Secondary | ICD-10-CM | POA: Diagnosis not present

## 2020-05-13 ENCOUNTER — Other Ambulatory Visit (HOSPITAL_BASED_OUTPATIENT_CLINIC_OR_DEPARTMENT_OTHER): Payer: Self-pay | Admitting: Hematology and Oncology

## 2020-05-13 MED FILL — MORPHINE SULF ER 30 MG TAB: 30 | 30 days supply | Qty: 60 | Fill #0

## 2020-05-16 MED FILL — MIRTAZAPINE 15 MG TABLET: 15 | 30 days supply | Qty: 30 | Fill #1

## 2020-05-25 ENCOUNTER — Telehealth: Payer: Self-pay | Admitting: Family Medicine

## 2020-05-25 NOTE — Telephone Encounter (Signed)
Patient declined AWV 

## 2020-06-10 ENCOUNTER — Other Ambulatory Visit (HOSPITAL_BASED_OUTPATIENT_CLINIC_OR_DEPARTMENT_OTHER): Payer: Self-pay | Admitting: Hematology & Oncology

## 2020-06-10 MED FILL — MORPHINE SULF ER 30 MG TAB: 30 | 30 days supply | Qty: 60 | Fill #0

## 2020-06-13 MED FILL — MIRTAZAPINE 15 MG TABLET: 15 | 30 days supply | Qty: 30 | Fill #2

## 2020-07-08 ENCOUNTER — Other Ambulatory Visit (HOSPITAL_BASED_OUTPATIENT_CLINIC_OR_DEPARTMENT_OTHER): Payer: Self-pay

## 2020-07-08 MED ORDER — MORPHINE SULFATE ER 30 MG PO TBCR
EXTENDED_RELEASE_TABLET | ORAL | 0 refills | Status: DC
  Filled 2020-07-08: qty 60, 30d supply, fill #0

## 2020-07-11 ENCOUNTER — Other Ambulatory Visit (HOSPITAL_BASED_OUTPATIENT_CLINIC_OR_DEPARTMENT_OTHER): Payer: Self-pay

## 2020-07-11 MED FILL — Mirtazapine Tab 15 MG: ORAL | 30 days supply | Qty: 30 | Fill #0 | Status: AC

## 2020-08-11 ENCOUNTER — Other Ambulatory Visit (HOSPITAL_BASED_OUTPATIENT_CLINIC_OR_DEPARTMENT_OTHER): Payer: Self-pay

## 2020-08-11 ENCOUNTER — Other Ambulatory Visit: Payer: Self-pay | Admitting: Family Medicine

## 2020-08-11 DIAGNOSIS — R630 Anorexia: Secondary | ICD-10-CM

## 2020-08-11 MED ORDER — MORPHINE SULFATE ER 30 MG PO TBCR
EXTENDED_RELEASE_TABLET | ORAL | 0 refills | Status: DC
  Filled 2020-08-11: qty 60, 30d supply, fill #0

## 2020-08-11 MED ORDER — MIRTAZAPINE 15 MG PO TABS
ORAL_TABLET | Freq: Every day | ORAL | 0 refills | Status: DC
  Filled 2020-08-11 – 2020-08-12 (×2): qty 30, 30d supply, fill #0

## 2020-08-12 ENCOUNTER — Telehealth: Payer: Self-pay | Admitting: Family Medicine

## 2020-08-12 ENCOUNTER — Other Ambulatory Visit: Payer: Self-pay | Admitting: Family Medicine

## 2020-08-12 ENCOUNTER — Other Ambulatory Visit (HOSPITAL_BASED_OUTPATIENT_CLINIC_OR_DEPARTMENT_OTHER): Payer: Self-pay

## 2020-08-12 DIAGNOSIS — R630 Anorexia: Secondary | ICD-10-CM

## 2020-08-12 DIAGNOSIS — K219 Gastro-esophageal reflux disease without esophagitis: Secondary | ICD-10-CM

## 2020-08-12 MED ORDER — MIRTAZAPINE 15 MG PO TABS
ORAL_TABLET | Freq: Every day | ORAL | 0 refills | Status: AC
  Filled 2020-08-12: qty 30, fill #0
  Filled 2020-09-12: qty 30, 30d supply, fill #0

## 2020-08-12 MED ORDER — PANTOPRAZOLE SODIUM 40 MG PO TBEC
DELAYED_RELEASE_TABLET | Freq: Every day | ORAL | 0 refills | Status: AC
  Filled 2020-08-12: qty 30, 30d supply, fill #0

## 2020-08-12 NOTE — Telephone Encounter (Signed)
Patients wife came in to get refill for pt  It was sent in yesterday for [place] to refill, pharm did not have it  Patients wife asked about it so I sent message to BB to get it sent again  BB stated he is overdue for a OV so I let wife know wife stated that patient will not come in office due to him NOT wearing masks  Patient does need a OV appt for med refills etc  NO VIRTUAL

## 2020-09-12 ENCOUNTER — Other Ambulatory Visit (HOSPITAL_BASED_OUTPATIENT_CLINIC_OR_DEPARTMENT_OTHER): Payer: Self-pay

## 2020-09-12 MED ORDER — MORPHINE SULFATE ER 30 MG PO TBCR
EXTENDED_RELEASE_TABLET | ORAL | 0 refills | Status: DC
  Filled 2020-09-12: qty 60, 30d supply, fill #0

## 2020-10-07 ENCOUNTER — Other Ambulatory Visit (HOSPITAL_BASED_OUTPATIENT_CLINIC_OR_DEPARTMENT_OTHER): Payer: Self-pay

## 2020-10-07 MED ORDER — MORPHINE SULFATE ER 30 MG PO TBCR
EXTENDED_RELEASE_TABLET | ORAL | 0 refills | Status: DC
  Filled 2020-10-10: qty 60, 30d supply, fill #0

## 2020-10-10 ENCOUNTER — Other Ambulatory Visit (HOSPITAL_BASED_OUTPATIENT_CLINIC_OR_DEPARTMENT_OTHER): Payer: Self-pay

## 2020-10-11 ENCOUNTER — Other Ambulatory Visit (HOSPITAL_BASED_OUTPATIENT_CLINIC_OR_DEPARTMENT_OTHER): Payer: Self-pay

## 2020-10-12 ENCOUNTER — Other Ambulatory Visit (HOSPITAL_BASED_OUTPATIENT_CLINIC_OR_DEPARTMENT_OTHER): Payer: Self-pay

## 2020-11-08 ENCOUNTER — Other Ambulatory Visit (HOSPITAL_BASED_OUTPATIENT_CLINIC_OR_DEPARTMENT_OTHER): Payer: Self-pay

## 2020-11-08 MED ORDER — MORPHINE SULFATE ER 30 MG PO TBCR
EXTENDED_RELEASE_TABLET | ORAL | 0 refills | Status: DC
  Filled 2020-11-11: qty 8, 4d supply, fill #0
  Filled 2020-11-11: qty 52, 26d supply, fill #0

## 2020-11-11 ENCOUNTER — Other Ambulatory Visit (HOSPITAL_BASED_OUTPATIENT_CLINIC_OR_DEPARTMENT_OTHER): Payer: Self-pay

## 2020-11-14 ENCOUNTER — Other Ambulatory Visit (HOSPITAL_BASED_OUTPATIENT_CLINIC_OR_DEPARTMENT_OTHER): Payer: Self-pay

## 2020-11-18 ENCOUNTER — Other Ambulatory Visit (HOSPITAL_BASED_OUTPATIENT_CLINIC_OR_DEPARTMENT_OTHER): Payer: Self-pay

## 2020-11-24 ENCOUNTER — Other Ambulatory Visit (HOSPITAL_BASED_OUTPATIENT_CLINIC_OR_DEPARTMENT_OTHER): Payer: Self-pay

## 2020-11-30 ENCOUNTER — Other Ambulatory Visit (HOSPITAL_BASED_OUTPATIENT_CLINIC_OR_DEPARTMENT_OTHER): Payer: Self-pay

## 2020-12-08 ENCOUNTER — Other Ambulatory Visit (HOSPITAL_BASED_OUTPATIENT_CLINIC_OR_DEPARTMENT_OTHER): Payer: Self-pay

## 2020-12-08 MED ORDER — MORPHINE SULFATE ER 30 MG PO TBCR
EXTENDED_RELEASE_TABLET | ORAL | 0 refills | Status: DC
  Filled 2020-12-12: qty 60, 30d supply, fill #0

## 2020-12-09 ENCOUNTER — Other Ambulatory Visit (HOSPITAL_BASED_OUTPATIENT_CLINIC_OR_DEPARTMENT_OTHER): Payer: Self-pay

## 2020-12-12 ENCOUNTER — Other Ambulatory Visit (HOSPITAL_BASED_OUTPATIENT_CLINIC_OR_DEPARTMENT_OTHER): Payer: Self-pay

## 2020-12-19 ENCOUNTER — Other Ambulatory Visit (HOSPITAL_BASED_OUTPATIENT_CLINIC_OR_DEPARTMENT_OTHER): Payer: Self-pay

## 2020-12-26 ENCOUNTER — Other Ambulatory Visit (HOSPITAL_BASED_OUTPATIENT_CLINIC_OR_DEPARTMENT_OTHER): Payer: Self-pay

## 2021-01-12 ENCOUNTER — Other Ambulatory Visit (HOSPITAL_BASED_OUTPATIENT_CLINIC_OR_DEPARTMENT_OTHER): Payer: Self-pay

## 2021-01-12 MED ORDER — MORPHINE SULFATE 15 MG PO TABS
ORAL_TABLET | ORAL | 0 refills | Status: DC
  Filled 2021-01-12: qty 30, 30d supply, fill #0

## 2021-01-12 MED ORDER — MORPHINE SULFATE ER 30 MG PO TBCR
EXTENDED_RELEASE_TABLET | ORAL | 0 refills | Status: DC
  Filled 2021-01-12: qty 60, 30d supply, fill #0

## 2021-01-13 ENCOUNTER — Other Ambulatory Visit (HOSPITAL_BASED_OUTPATIENT_CLINIC_OR_DEPARTMENT_OTHER): Payer: Self-pay

## 2021-02-01 DIAGNOSIS — M363 Arthropathy in other blood disorders: Secondary | ICD-10-CM | POA: Diagnosis not present

## 2021-02-01 DIAGNOSIS — D759 Disease of blood and blood-forming organs, unspecified: Secondary | ICD-10-CM | POA: Diagnosis not present

## 2021-02-01 DIAGNOSIS — D66 Hereditary factor VIII deficiency: Secondary | ICD-10-CM | POA: Diagnosis not present

## 2021-02-01 DIAGNOSIS — B1920 Unspecified viral hepatitis C without hepatic coma: Secondary | ICD-10-CM | POA: Diagnosis not present

## 2021-02-01 DIAGNOSIS — F1190 Opioid use, unspecified, uncomplicated: Secondary | ICD-10-CM | POA: Diagnosis not present

## 2021-02-01 DIAGNOSIS — M362 Hemophilic arthropathy: Secondary | ICD-10-CM | POA: Diagnosis not present

## 2021-02-14 ENCOUNTER — Other Ambulatory Visit (HOSPITAL_BASED_OUTPATIENT_CLINIC_OR_DEPARTMENT_OTHER): Payer: Self-pay

## 2021-02-14 MED ORDER — MORPHINE SULFATE ER 30 MG PO TBCR
EXTENDED_RELEASE_TABLET | ORAL | 0 refills | Status: DC
Start: 2021-02-14 — End: 2021-03-09
  Filled 2021-02-14: qty 60, 30d supply, fill #0

## 2021-02-14 MED ORDER — MORPHINE SULFATE ER 15 MG PO TBCR
EXTENDED_RELEASE_TABLET | ORAL | 0 refills | Status: DC
  Filled 2021-02-14: qty 30, 30d supply, fill #0

## 2021-02-20 ENCOUNTER — Other Ambulatory Visit (HOSPITAL_BASED_OUTPATIENT_CLINIC_OR_DEPARTMENT_OTHER): Payer: Self-pay

## 2021-02-22 ENCOUNTER — Other Ambulatory Visit (HOSPITAL_BASED_OUTPATIENT_CLINIC_OR_DEPARTMENT_OTHER): Payer: Self-pay

## 2021-03-09 ENCOUNTER — Other Ambulatory Visit (HOSPITAL_BASED_OUTPATIENT_CLINIC_OR_DEPARTMENT_OTHER): Payer: Self-pay

## 2021-03-09 MED ORDER — MORPHINE SULFATE ER 30 MG PO TBCR
30.0000 mg | EXTENDED_RELEASE_TABLET | Freq: Two times a day (BID) | ORAL | 0 refills | Status: DC
  Filled 2021-03-14: qty 60, 30d supply, fill #0

## 2021-03-14 ENCOUNTER — Other Ambulatory Visit (HOSPITAL_BASED_OUTPATIENT_CLINIC_OR_DEPARTMENT_OTHER): Payer: Self-pay

## 2021-04-17 ENCOUNTER — Other Ambulatory Visit (HOSPITAL_BASED_OUTPATIENT_CLINIC_OR_DEPARTMENT_OTHER): Payer: Self-pay

## 2021-04-18 ENCOUNTER — Other Ambulatory Visit (HOSPITAL_BASED_OUTPATIENT_CLINIC_OR_DEPARTMENT_OTHER): Payer: Self-pay

## 2021-04-18 MED ORDER — MORPHINE SULFATE ER 30 MG PO TBCR
30.0000 mg | EXTENDED_RELEASE_TABLET | Freq: Two times a day (BID) | ORAL | 0 refills | Status: DC
  Filled 2021-04-18: qty 60, 30d supply, fill #0

## 2021-04-18 MED ORDER — MORPHINE SULFATE ER 15 MG PO TBCR
15.0000 mg | EXTENDED_RELEASE_TABLET | Freq: Every evening | ORAL | 0 refills | Status: DC
  Filled 2021-04-18: qty 30, 30d supply, fill #0

## 2021-04-21 ENCOUNTER — Other Ambulatory Visit (HOSPITAL_BASED_OUTPATIENT_CLINIC_OR_DEPARTMENT_OTHER): Payer: Self-pay

## 2021-05-17 ENCOUNTER — Other Ambulatory Visit (HOSPITAL_BASED_OUTPATIENT_CLINIC_OR_DEPARTMENT_OTHER): Payer: Self-pay

## 2021-05-17 MED ORDER — MORPHINE SULFATE ER 30 MG PO TBCR
30.0000 mg | EXTENDED_RELEASE_TABLET | Freq: Two times a day (BID) | ORAL | 0 refills | Status: DC
  Filled 2021-05-17: qty 60, 30d supply, fill #0

## 2021-05-22 ENCOUNTER — Other Ambulatory Visit (HOSPITAL_BASED_OUTPATIENT_CLINIC_OR_DEPARTMENT_OTHER): Payer: Self-pay

## 2021-05-31 ENCOUNTER — Other Ambulatory Visit (HOSPITAL_BASED_OUTPATIENT_CLINIC_OR_DEPARTMENT_OTHER): Payer: Self-pay

## 2021-06-16 ENCOUNTER — Other Ambulatory Visit (HOSPITAL_BASED_OUTPATIENT_CLINIC_OR_DEPARTMENT_OTHER): Payer: Self-pay

## 2021-06-16 MED ORDER — MORPHINE SULFATE ER 30 MG PO TBCR
EXTENDED_RELEASE_TABLET | ORAL | 0 refills | Status: DC
  Filled 2021-06-19: qty 60, 30d supply, fill #0

## 2021-06-19 ENCOUNTER — Other Ambulatory Visit (HOSPITAL_BASED_OUTPATIENT_CLINIC_OR_DEPARTMENT_OTHER): Payer: Self-pay

## 2021-06-20 ENCOUNTER — Other Ambulatory Visit (HOSPITAL_BASED_OUTPATIENT_CLINIC_OR_DEPARTMENT_OTHER): Payer: Self-pay

## 2021-07-06 DIAGNOSIS — M362 Hemophilic arthropathy: Secondary | ICD-10-CM | POA: Diagnosis not present

## 2021-07-06 DIAGNOSIS — F1190 Opioid use, unspecified, uncomplicated: Secondary | ICD-10-CM | POA: Diagnosis not present

## 2021-07-06 DIAGNOSIS — D66 Hereditary factor VIII deficiency: Secondary | ICD-10-CM | POA: Diagnosis not present

## 2021-07-06 DIAGNOSIS — Z452 Encounter for adjustment and management of vascular access device: Secondary | ICD-10-CM | POA: Diagnosis not present

## 2021-07-24 ENCOUNTER — Other Ambulatory Visit (HOSPITAL_BASED_OUTPATIENT_CLINIC_OR_DEPARTMENT_OTHER): Payer: Self-pay

## 2021-07-24 MED ORDER — NALOXONE HCL 4 MG/0.1ML NA LIQD
NASAL | 2 refills | Status: DC
  Filled 2021-07-24: qty 2, 1d supply, fill #0

## 2021-07-24 MED ORDER — MORPHINE SULFATE ER 30 MG PO CP24
ORAL_CAPSULE | ORAL | 0 refills | Status: DC
  Filled 2021-07-24: qty 60, 30d supply, fill #0

## 2021-07-25 ENCOUNTER — Other Ambulatory Visit (HOSPITAL_BASED_OUTPATIENT_CLINIC_OR_DEPARTMENT_OTHER): Payer: Self-pay

## 2021-07-25 MED ORDER — MORPHINE SULFATE ER 30 MG PO TBCR
EXTENDED_RELEASE_TABLET | ORAL | 0 refills | Status: DC
  Filled 2021-07-25: qty 60, 30d supply, fill #0

## 2021-08-21 ENCOUNTER — Other Ambulatory Visit (HOSPITAL_BASED_OUTPATIENT_CLINIC_OR_DEPARTMENT_OTHER): Payer: Self-pay

## 2021-08-21 MED ORDER — MORPHINE SULFATE ER 15 MG PO TBCR
EXTENDED_RELEASE_TABLET | ORAL | 0 refills | Status: DC
  Filled 2021-08-21: qty 15, 15d supply, fill #0

## 2021-08-21 MED ORDER — MORPHINE SULFATE ER 30 MG PO TBCR
EXTENDED_RELEASE_TABLET | ORAL | 0 refills | Status: DC
  Filled 2021-08-25: qty 60, 30d supply, fill #0

## 2021-08-25 ENCOUNTER — Other Ambulatory Visit (HOSPITAL_BASED_OUTPATIENT_CLINIC_OR_DEPARTMENT_OTHER): Payer: Self-pay

## 2021-09-20 ENCOUNTER — Other Ambulatory Visit (HOSPITAL_BASED_OUTPATIENT_CLINIC_OR_DEPARTMENT_OTHER): Payer: Self-pay

## 2021-09-20 MED ORDER — MORPHINE SULFATE ER 15 MG PO TBCR
EXTENDED_RELEASE_TABLET | ORAL | 0 refills | Status: DC
  Filled 2021-09-20: qty 30, 30d supply, fill #0

## 2021-10-09 ENCOUNTER — Other Ambulatory Visit (HOSPITAL_BASED_OUTPATIENT_CLINIC_OR_DEPARTMENT_OTHER): Payer: Self-pay

## 2021-10-09 MED ORDER — MORPHINE SULFATE ER 15 MG PO TBCR
15.0000 mg | EXTENDED_RELEASE_TABLET | Freq: Two times a day (BID) | ORAL | 0 refills | Status: DC
  Filled 2021-11-21: qty 30, 15d supply, fill #0

## 2021-10-12 ENCOUNTER — Other Ambulatory Visit (HOSPITAL_BASED_OUTPATIENT_CLINIC_OR_DEPARTMENT_OTHER): Payer: Self-pay

## 2021-10-15 ENCOUNTER — Other Ambulatory Visit (HOSPITAL_BASED_OUTPATIENT_CLINIC_OR_DEPARTMENT_OTHER): Payer: Self-pay

## 2021-10-16 ENCOUNTER — Other Ambulatory Visit (HOSPITAL_BASED_OUTPATIENT_CLINIC_OR_DEPARTMENT_OTHER): Payer: Self-pay

## 2021-10-16 MED ORDER — MORPHINE SULFATE ER 30 MG PO TBCR
EXTENDED_RELEASE_TABLET | ORAL | 0 refills | Status: DC
  Filled 2021-10-16 (×2): qty 60, 30d supply, fill #0

## 2021-10-16 MED ORDER — MORPHINE SULFATE ER 15 MG PO TBCR
EXTENDED_RELEASE_TABLET | ORAL | 0 refills | Status: DC
  Filled 2021-10-16 (×2): qty 90, 30d supply, fill #0

## 2021-10-17 ENCOUNTER — Other Ambulatory Visit (HOSPITAL_BASED_OUTPATIENT_CLINIC_OR_DEPARTMENT_OTHER): Payer: Self-pay

## 2021-11-10 ENCOUNTER — Telehealth: Payer: Self-pay

## 2021-11-10 NOTE — Telephone Encounter (Signed)
Lvm to schedule

## 2021-11-21 ENCOUNTER — Other Ambulatory Visit (HOSPITAL_BASED_OUTPATIENT_CLINIC_OR_DEPARTMENT_OTHER): Payer: Self-pay

## 2021-11-22 ENCOUNTER — Other Ambulatory Visit (HOSPITAL_BASED_OUTPATIENT_CLINIC_OR_DEPARTMENT_OTHER): Payer: Self-pay

## 2021-11-22 MED ORDER — MORPHINE SULFATE ER 15 MG PO TBCR: 15.0000 mg | EXTENDED_RELEASE_TABLET | Freq: Three times a day (TID) | ORAL | 0 refills | Status: DC | PRN

## 2021-11-22 MED ORDER — MORPHINE SULFATE ER 30 MG PO TBCR
30.0000 mg | EXTENDED_RELEASE_TABLET | Freq: Two times a day (BID) | ORAL | 0 refills | Status: DC | PRN
  Filled 2021-11-22: qty 60, 30d supply, fill #0

## 2021-11-23 ENCOUNTER — Other Ambulatory Visit (HOSPITAL_BASED_OUTPATIENT_CLINIC_OR_DEPARTMENT_OTHER): Payer: Self-pay

## 2021-12-21 ENCOUNTER — Other Ambulatory Visit (HOSPITAL_BASED_OUTPATIENT_CLINIC_OR_DEPARTMENT_OTHER): Payer: Self-pay

## 2021-12-21 MED ORDER — MORPHINE SULFATE ER 15 MG PO TBCR
EXTENDED_RELEASE_TABLET | ORAL | 0 refills | Status: DC
  Filled 2021-12-21: qty 90, 30d supply, fill #0

## 2022-01-10 DIAGNOSIS — K7460 Unspecified cirrhosis of liver: Secondary | ICD-10-CM | POA: Diagnosis not present

## 2022-01-10 DIAGNOSIS — H2513 Age-related nuclear cataract, bilateral: Secondary | ICD-10-CM | POA: Diagnosis not present

## 2022-01-10 DIAGNOSIS — H04123 Dry eye syndrome of bilateral lacrimal glands: Secondary | ICD-10-CM | POA: Diagnosis not present

## 2022-01-10 DIAGNOSIS — Z0100 Encounter for examination of eyes and vision without abnormal findings: Secondary | ICD-10-CM | POA: Diagnosis not present

## 2022-01-31 ENCOUNTER — Other Ambulatory Visit (HOSPITAL_BASED_OUTPATIENT_CLINIC_OR_DEPARTMENT_OTHER): Payer: Self-pay

## 2022-01-31 MED ORDER — MORPHINE SULFATE ER 15 MG PO TBCR
15.0000 mg | EXTENDED_RELEASE_TABLET | Freq: Three times a day (TID) | ORAL | 0 refills | Status: DC
  Filled 2022-01-31: qty 90, 30d supply, fill #0

## 2022-03-29 DIAGNOSIS — M362 Hemophilic arthropathy: Secondary | ICD-10-CM | POA: Diagnosis not present

## 2022-03-29 DIAGNOSIS — I878 Other specified disorders of veins: Secondary | ICD-10-CM | POA: Diagnosis not present

## 2022-03-29 DIAGNOSIS — F1190 Opioid use, unspecified, uncomplicated: Secondary | ICD-10-CM | POA: Diagnosis not present

## 2022-03-29 DIAGNOSIS — Z452 Encounter for adjustment and management of vascular access device: Secondary | ICD-10-CM | POA: Diagnosis not present

## 2022-03-29 DIAGNOSIS — D66 Hereditary factor VIII deficiency: Secondary | ICD-10-CM | POA: Diagnosis not present

## 2022-03-30 ENCOUNTER — Other Ambulatory Visit (HOSPITAL_BASED_OUTPATIENT_CLINIC_OR_DEPARTMENT_OTHER): Payer: Self-pay

## 2022-03-30 MED ORDER — MORPHINE SULFATE ER 15 MG PO TBCR
15.0000 mg | EXTENDED_RELEASE_TABLET | Freq: Three times a day (TID) | ORAL | 0 refills | Status: DC | PRN
  Filled 2022-03-30: qty 90, 30d supply, fill #0

## 2022-05-29 ENCOUNTER — Telehealth: Payer: Self-pay | Admitting: *Deleted

## 2022-05-29 NOTE — Progress Notes (Signed)
  Care Coordination  Outreach Note  05/29/2022 Name: Darren Torres MRN: [id number] DOB: Apr 19, 1955   Care Coordination Outreach Attempts: An unsuccessful telephone outreach was attempted today to offer the patient information about available care coordination services as a benefit of their health plan.   Follow Up Plan:  Additional outreach attempts will be made to offer the patient care coordination information and services.   Encounter Outcome:  No Answer  Julian Hy, [place] Direct Dial: [phone]

## 2022-06-19 NOTE — Progress Notes (Signed)
  Care Coordination  Outreach Note  06/19/2022 Name: Darren Torres MRN: [id number] DOB: March 01, 1956   Care Coordination Outreach Attempts: A second unsuccessful outreach was attempted today to offer the patient with information about available care coordination services as a benefit of their health plan.     Follow Up Plan:  Additional outreach attempts will be made to offer the patient care coordination information and services.   Encounter Outcome:  No Answer  Julian Hy, [place] Direct Dial: [phone]

## 2022-06-27 NOTE — Progress Notes (Signed)
  Care Coordination  Outreach Note  06/27/2022 Name: Darren Torres MRN: [id number] DOB: [birth year]   Care Coordination Outreach Attempts: A third unsuccessful outreach was attempted today to offer the patient with information about available care coordination services as a benefit of their health plan.   Follow Up Plan:  No further outreach attempts will be made at this time. We have been unable to contact the patient to offer or enroll patient in care coordination services  Encounter Outcome:  No Answer  Burman Nieves, [facility] Care Coordination Care Guide Direct Dial: [phone]

## 2022-07-31 ENCOUNTER — Other Ambulatory Visit (HOSPITAL_BASED_OUTPATIENT_CLINIC_OR_DEPARTMENT_OTHER): Payer: Self-pay

## 2022-07-31 ENCOUNTER — Other Ambulatory Visit: Payer: Self-pay

## 2022-07-31 MED ORDER — MORPHINE SULFATE ER 15 MG PO TBCR
15.0000 mg | EXTENDED_RELEASE_TABLET | Freq: Two times a day (BID) | ORAL | 0 refills | Status: AC
  Filled 2022-07-31: qty 60, 30d supply, fill #0

## 2022-10-29 ENCOUNTER — Other Ambulatory Visit: Payer: Self-pay

## 2022-11-05 ENCOUNTER — Other Ambulatory Visit (HOSPITAL_BASED_OUTPATIENT_CLINIC_OR_DEPARTMENT_OTHER): Payer: Self-pay

## 2022-11-05 MED ORDER — MORPHINE SULFATE ER 15 MG PO TBCR
15.0000 mg | EXTENDED_RELEASE_TABLET | Freq: Two times a day (BID) | ORAL | 0 refills | Status: DC
  Filled 2022-11-05: qty 60, 30d supply, fill #0

## 2022-12-18 NOTE — Progress Notes (Addendum)
[facility] at [facility] [address], [address] [place], [place] [address] [phone]  Date:  12/20/2022   Name:  Darren Torres   DOB:  11-23-1955   MRN:  [id number]  PCP:  Pearline Cables, MD    Chief Complaint: Annual Exam (Concerns/ questions: diarrhea/HIV screen due/Pna due/Tdap- Medicare/Flu shot today: )   History of Present Illness:  Darren Torres is a 67 y.o. very pleasant male patient who presents with the following:  Patient seen today for physical.  I have not seen him in about 3 years, since October 2021 History of hepatitis C status post curative treatment, hepatic cirrhosis, hemophilia, chronic pain and associated degenerative joint disease, depression, chronic kidney disease  From our last visit 10/21-  Patient today with concern of abdominal pain, lack of appetite and weight loss I recommended that we obtain a CT scan to rule out mass or other dangerous cause of his symptoms Patient states that he is unable to wear a mask as required due to current pandemic for any imaging procedure. He also states that COVID-19 vaccines actually cause people to transmit Covid to others, and states that he is unwilling to be vaccinated.He states he would "rather die at home"  than wear a mask for further evaluation of his current concern abdominal symptoms.  As such, I will obtain blood work as above and leave any further evaluation up to the patient  He continues to follow-up with the [facility] for his hemophilia, most recent visit was in January of this year Diagnoses: 1. Severe factor VIII deficiency.  2. Hepatitis C infection, refractory to treatment. Genotype 1. S/p Harvoni, 3. Hemophilic arthropathy.   Current Treatment: NOVOEIGHT 3000IU q3 days and on demand for bleeding  Pain- hemophiliac arthropathy. He is on a MS Contin 30 mg XR twice daily and a 15 mg XR night PRN. The latter use of XR is not a typical  breakthrough regimen but it does work well in this patient and we have agreed to continue. He reports he only needs the 15mg  XR at night a couple times a year. Pain manageable. Have discussed non opioid options today including Tylenol and Celebrex. He reports he tried Celebrex in the past and 'it did not help me.' He does use Tylenol on occasion and recognizes utility of trying this medication before using a PRN Morphine.  At last refill pharmacy filled MS Contin 15mg  IR rather than XR and he reports when he did have breakthrough pain at night, he was being woken up in the middle of the night in pain because the IR does not last long enough to get a full nights sleep and then he is having to take throughout the night and it is causing issues with his overall pain control. He expressed the 15mg  XR for breakthrough has worked for him for over 10 years and he needs it very infrequently but it allows him to sleep through the night when he does experience breakthrough pain during the night and provide much better overall pain management. He has experienced no adverse issues and since this has worked for him for so long with good control and no issues, find it reasonable to continue this plan.  Access. Left arm PICC in place 2018, still in place. Right arm PICC removed after 4 years and broken line. We have reviewed potential that [facility] may allow removal for reducing the risk of access, including risk of thrombosis,  infection, pt notes he needs to have access anyway's since he is unable to stick with on-demand. Advised to monitor for any issues and let us know. I have spoken with IR in past and sounds like they do not replace unless having issues. Declines any issues or concerns with it today.  We spent some time with him in regards to his pain regimen and treatment planning there. Historically he has taken MSContin 30mg  BID with very limited need for breakthough. He had been taking MS Contin 15mg  qhs rarely, as  a breakthrough agent at nighttime, in addition to the usual 30/30 BID. This was changed for a more traditional short-acting agent (MSIR 15mg ) but he reports a high level of dissatisfaction with this, since this is invariably at nighttime and the short acting provides analgesia for but a couple hours, and he would need to wake up every 3 hours for another short acting dose to get though the night. He would take a far greater amount of MME using a short-acting as a breakthough. He would like to go back to MSContin 30/30 + 15 PRN. Which worked for some years. I think this to be reasonable and will write for this with an explanation to the pharmacy so that it can be understood and filled.  We discussed patients receiving chronic opoid therapy need to be evaluated every 3 months to evaluate if this is appropriate to continue per the 3 month after Rx re: CSRS provisions, which is a bit more frequent than the historical annual visits for his comprehensive visits. Pt amendable to telehelath visits and then and in-person visit in 12 mo for formal comprehensive visit.  Some blood work was done at [facility] back in January, can offer to update and do cholesterol, PSA Lung cancer screening Colon cancer screening-we discussed, this time he does not prefer colonoscopy but is interested in Cologuard Patient has typically declined all immunizations.    They note he is tapering off morphine- he hopes to be able to do without his pain medication altogether 1 day He notes his joint pains are better since he had both knees replaced and his ankles fused, and his rotator cuff repaired  They had to remove some of his large intestine removed due to necrotic bowel- this happened a few years ago  He notes his appetite will come and go- some days he eats well, some times he does not   Patient notes both he and his wife suffer from diarrhea.  His wife Alexia Freestone has had C. difficile in the past He thought diarrhea might be due to  tapering down his morphine but he has been stable on the same dose of morphine for several months  He is also concerned about weight loss.  He does potentially eat less as above, but does not think he has cut calories enough to explain this degree of weight loss   Wt Readings from Last 3 Encounters:  12/20/22 123 lb 9.6 oz (56.1 kg)  12/23/19 133 lb (60.3 kg)  09/24/18 135 lb (61.2 kg)     Patient Active Problem List   Diagnosis Date Noted   Hepatic cirrhosis ([facility]) 05/30/2015   Cataract 04/28/2012   DJD (degenerative joint disease) 04/28/2012   Abnormality of gait 04/28/2012   Hemophilia ([facility]) 05/28/2011   Hepatitis C 05/28/2011   Chronic pain 05/28/2011   Depression 05/28/2011    Past Medical History:  Diagnosis Date   Chronic kidney disease    Clotting disorder ([facility])  Depression    Hemophilia ([facility])    Hepatitis C     Past Surgical History:  Procedure Laterality Date   JOINT REPLACEMENT      Social History   Tobacco Use   Smoking status: Every Day    Current packs/day: 0.50    Types: Cigarettes   Smokeless tobacco: Never  Substance Use Topics   Alcohol use: No   Drug use: No    Family History  Problem Relation Age of Onset   Arthritis Mother    Heart disease Father    Heart disease Maternal Grandmother    Heart disease Maternal Grandfather    Cancer Paternal Grandmother    Heart disease Paternal Grandfather     Allergies  Allergen Reactions   Chantix [Varenicline Tartrate] Other (See Comments)    Changes mood   Wellbutrin [Bupropion Hcl] Palpitations   Asa Arthritis Strength-Antacid [Aspirin Buffered] Other (See Comments)    Due to blood thinner   Other     cryopercipate    Medication list has been reviewed and updated.  Current Outpatient Medications on File Prior to Visit  Medication Sig Dispense Refill   Casanthranol-Docusate Sodium 30-100 MG CAPS Take by mouth as needed.      morphine (MS CONTIN) 15 MG 12 hr tablet Take 1 tablet (15 mg  total) by mouth 2 (two) times daily. 60 tablet 0   Antihemophilic Factor, Recomb, (HELIXATE FS) 3000 units KIT every other day.     mirtazapine (REMERON) 15 MG tablet TAKE 1 TABLET (15 MG TOTAL) BY MOUTH AT BEDTIME. 30 tablet 0   pantoprazole (PROTONIX) 40 MG tablet TAKE 1 TABLET BY MOUTH ONCE DAILY 30 tablet 0   No current facility-administered medications on file prior to visit.    Review of Systems:  As per HPI- otherwise negative.   Physical Examination: Vitals:   12/20/22 1359  BP: 134/80  Pulse: 63  Resp: 18  Temp: 97.7 F (36.5 C)  SpO2: 97%   Vitals:   12/20/22 1359  Weight: 123 lb 9.6 oz (56.1 kg)  Height: 5\' 10"  (1.778 m)   Body mass index is 17.73 kg/m. Ideal Body Weight: Weight in (lb) to have BMI = 25: 173.9  GEN: no acute distress.  Underweight, does not appear acutely ill HEENT: Atraumatic, Normocephalic.  Ears and Nose: No external deformity. CV: RRR, No M/G/R. No JVD. No thrill. No extra heart sounds. PULM: CTA B, no wheezes, crackles, rhonchi. No retractions. No resp. distress. No accessory muscle use. ABD: S, NT, ND, +BS. No rebound. No HSM. EXTR: No c/c/e PSYCH: Normally interactive. Conversant.    Assessment and Plan: Cirrhosis of liver without ascites, unspecified hepatic cirrhosis type ([facility]) - Plan: CT CHEST ABDOMEN PELVIS W CONTRAST, CBC, Comprehensive metabolic panel  Weight loss, unintentional - Plan: CT CHEST ABDOMEN PELVIS W CONTRAST  Special screening, prostate cancer - Plan: TSH, PSA  Screening for diabetes mellitus - Plan: Hemoglobin A1c  Screening, lipid - Plan: Lipid panel  Chronic diarrhea - Plan: Clostridium Difficile by PCR([facility])  Screening for colon cancer - Plan: Cologuard  Patient seen today for follow-up.  I have not seen him in a few years.  Main concern today is unintended weight loss.  Will obtain blood work as above, CT chest abdomen and pelvis Also ordered Cologuard, check thyroid, PSA, lipids,  A1c Diarrhea is a concern.  There is history of C. difficile infection household, will run a C. difficile test for patient   Signed Shanda Bumps Laneisha Mino,  MD  Received labs as below-letter to patient  Results for orders placed or performed in visit on 12/20/22  Hemoglobin A1c  Result Value Ref Range   Hgb A1c MFr Bld 5.6 4.6 - 6.5 %  TSH  Result Value Ref Range   TSH 1.31 0.35 - 5.50 uIU/mL  Lipid panel  Result Value Ref Range   Cholesterol 169 0 - 200 mg/dL   Triglycerides 60.4 0.0 - 149.0 mg/dL   HDL 54.09 >81.19 mg/dL   VLDL 14.7 0.0 - 82.9 mg/dL   LDL Cholesterol 98 0 - 99 mg/dL   Total CHOL/HDL Ratio 3    NonHDL 110.61   PSA  Result Value Ref Range   PSA 0.81 0.10 - 4.00 ng/mL  CBC  Result Value Ref Range   WBC 4.4 4.0 - 10.5 K/uL   RBC 4.68 4.22 - 5.81 Mil/uL   Platelets 179.0 150.0 - 400.0 K/uL   Hemoglobin 14.6 13.0 - 17.0 g/dL   HCT 56.2 13.0 - 86.5 %   MCV 94.7 78.0 - 100.0 fl   MCHC 32.9 30.0 - 36.0 g/dL   RDW 78.4 69.6 - 29.5 %  Comprehensive metabolic panel  Result Value Ref Range   Sodium 140 135 - 145 mEq/L   Potassium 4.4 3.5 - 5.1 mEq/L   Chloride 105 96 - 112 mEq/L   CO2 27 19 - 32 mEq/L   Glucose, Bld 109 (H) 70 - 99 mg/dL   BUN 8 6 - 23 mg/dL   Creatinine, Ser 2.84 0.40 - 1.50 mg/dL   Total Bilirubin 0.5 0.2 - 1.2 mg/dL   Alkaline Phosphatase 92 39 - 117 U/L   AST 18 0 - 37 U/L   ALT 8 0 - 53 U/L   Total Protein 7.0 6.0 - 8.3 g/dL   Albumin 4.4 3.5 - 5.2 g/dL   GFR 13.24 >40.10 mL/min   Calcium 9.6 8.4 - 10.5 mg/dL

## 2022-12-20 ENCOUNTER — Ambulatory Visit (INDEPENDENT_AMBULATORY_CARE_PROVIDER_SITE_OTHER): Payer: Medicare HMO | Admitting: Family Medicine

## 2022-12-20 VITALS — BP 134/80 | HR 63 | Temp 97.7°F | Resp 18 | Ht 70.0 in | Wt 123.6 lb

## 2022-12-20 DIAGNOSIS — K7460 Unspecified cirrhosis of liver: Secondary | ICD-10-CM | POA: Diagnosis not present

## 2022-12-20 DIAGNOSIS — Z13220 Encounter for screening for lipoid disorders: Secondary | ICD-10-CM

## 2022-12-20 DIAGNOSIS — K529 Noninfective gastroenteritis and colitis, unspecified: Secondary | ICD-10-CM | POA: Diagnosis not present

## 2022-12-20 DIAGNOSIS — R634 Abnormal weight loss: Secondary | ICD-10-CM

## 2022-12-20 DIAGNOSIS — Z131 Encounter for screening for diabetes mellitus: Secondary | ICD-10-CM

## 2022-12-20 DIAGNOSIS — Z1211 Encounter for screening for malignant neoplasm of colon: Secondary | ICD-10-CM | POA: Diagnosis not present

## 2022-12-20 DIAGNOSIS — Z125 Encounter for screening for malignant neoplasm of prostate: Secondary | ICD-10-CM

## 2022-12-20 NOTE — Patient Instructions (Signed)
It was good to see you today I will be in touch with your labs and CT results asap You will also get a Cologuard kit sent to your home

## 2022-12-21 ENCOUNTER — Encounter: Payer: Self-pay | Admitting: Family Medicine

## 2022-12-21 LAB — CBC
HCT: 44.3 % (ref 39.0–52.0)
Hemoglobin: 14.6 g/dL (ref 13.0–17.0)
MCHC: 32.9 g/dL (ref 30.0–36.0)
MCV: 94.7 fL (ref 78.0–100.0)
Platelets: 179 10*3/uL (ref 150.0–400.0)
RBC: 4.68 Mil/uL (ref 4.22–5.81)
RDW: 13.8 % (ref 11.5–15.5)
WBC: 4.4 10*3/uL (ref 4.0–10.5)

## 2022-12-21 LAB — TSH: TSH: 1.31 u[IU]/mL (ref 0.35–5.50)

## 2022-12-21 LAB — COMPREHENSIVE METABOLIC PANEL
ALT: 8 U/L (ref 0–53)
AST: 18 U/L (ref 0–37)
Albumin: 4.4 g/dL (ref 3.5–5.2)
Alkaline Phosphatase: 92 U/L (ref 39–117)
BUN: 8 mg/dL (ref 6–23)
CO2: 27 meq/L (ref 19–32)
Calcium: 9.6 mg/dL (ref 8.4–10.5)
Chloride: 105 meq/L (ref 96–112)
Creatinine, Ser: 0.75 mg/dL (ref 0.40–1.50)
GFR: 94.74 mL/min (ref >60.00)
Glucose, Bld: 109 mg/dL — ABNORMAL HIGH (ref 70–99)
Potassium: 4.4 meq/L (ref 3.5–5.1)
Sodium: 140 meq/L (ref 135–145)
Total Bilirubin: 0.5 mg/dL (ref 0.2–1.2)
Total Protein: 7 g/dL (ref 6.0–8.3)

## 2022-12-21 LAB — LIPID PANEL
Cholesterol: 169 mg/dL (ref 0–200)
HDL: 58 mg/dL (ref >39.00)
LDL Cholesterol: 98 mg/dL (ref 0–99)
NonHDL: 110.61
Total CHOL/HDL Ratio: 3
Triglycerides: 65 mg/dL (ref 0.0–149.0)
VLDL: 13 mg/dL (ref 0.0–40.0)

## 2022-12-21 LAB — HEMOGLOBIN A1C: Hgb A1c MFr Bld: 5.6 % (ref 4.6–6.5)

## 2022-12-21 LAB — PSA: PSA: 0.81 ng/mL (ref 0.10–4.00)

## 2022-12-25 ENCOUNTER — Other Ambulatory Visit: Payer: Medicare HMO

## 2022-12-25 DIAGNOSIS — K529 Noninfective gastroenteritis and colitis, unspecified: Secondary | ICD-10-CM

## 2022-12-25 NOTE — Progress Notes (Signed)
Specimen drop off

## 2022-12-25 NOTE — Addendum Note (Signed)
Addended by: Mervin Kung A on: 12/25/2022 04:51 PM   Modules accepted: Orders

## 2022-12-26 LAB — CLOSTRIDIUM DIFFICILE BY PCR: Toxigenic C. Difficile by PCR: NEGATIVE

## 2022-12-27 ENCOUNTER — Ambulatory Visit (HOSPITAL_BASED_OUTPATIENT_CLINIC_OR_DEPARTMENT_OTHER)
Admission: RE | Admit: 2022-12-27 | Discharge: 2022-12-27 | Disposition: A | Discharge status: Home / Self Care | Payer: Medicare HMO | Source: Ambulatory Visit | Attending: Family Medicine | Admitting: Family Medicine

## 2022-12-27 ENCOUNTER — Encounter (HOSPITAL_BASED_OUTPATIENT_CLINIC_OR_DEPARTMENT_OTHER): Payer: Self-pay

## 2022-12-27 DIAGNOSIS — Z87891 Personal history of nicotine dependence: Secondary | ICD-10-CM | POA: Diagnosis not present

## 2022-12-27 DIAGNOSIS — Z452 Encounter for adjustment and management of vascular access device: Secondary | ICD-10-CM | POA: Diagnosis not present

## 2022-12-27 DIAGNOSIS — K8020 Calculus of gallbladder without cholecystitis without obstruction: Secondary | ICD-10-CM | POA: Insufficient documentation

## 2022-12-27 DIAGNOSIS — K7460 Unspecified cirrhosis of liver: Secondary | ICD-10-CM | POA: Insufficient documentation

## 2022-12-27 DIAGNOSIS — R634 Abnormal weight loss: Secondary | ICD-10-CM | POA: Diagnosis not present

## 2022-12-27 MED ORDER — IOHEXOL 300 MG/ML  SOLN
100.0000 mL | Freq: Once | INTRAMUSCULAR | Status: AC | PRN
  Administered 2022-12-27: 100 mL via INTRAVENOUS

## 2023-01-02 DIAGNOSIS — Z1211 Encounter for screening for malignant neoplasm of colon: Secondary | ICD-10-CM | POA: Diagnosis not present

## 2023-01-10 ENCOUNTER — Encounter: Payer: Self-pay | Admitting: Family Medicine

## 2023-01-10 LAB — COLOGUARD: COLOGUARD: NEGATIVE

## 2023-01-15 ENCOUNTER — Encounter: Payer: Self-pay | Admitting: Family Medicine

## 2023-02-04 ENCOUNTER — Other Ambulatory Visit (HOSPITAL_BASED_OUTPATIENT_CLINIC_OR_DEPARTMENT_OTHER): Payer: Self-pay

## 2023-02-04 MED ORDER — MORPHINE SULFATE 15 MG PO TABS
15.0000 mg | ORAL_TABLET | Freq: Two times a day (BID) | ORAL | 0 refills | Status: DC
  Filled 2023-02-04: qty 60, 30d supply, fill #0

## 2023-03-04 ENCOUNTER — Other Ambulatory Visit (HOSPITAL_BASED_OUTPATIENT_CLINIC_OR_DEPARTMENT_OTHER): Payer: Self-pay

## 2023-03-04 MED ORDER — MORPHINE SULFATE 15 MG PO TABS
15.0000 mg | ORAL_TABLET | Freq: Two times a day (BID) | ORAL | 0 refills | Status: DC
  Filled 2023-03-04: qty 60, 30d supply, fill #0

## 2023-03-05 ENCOUNTER — Other Ambulatory Visit (HOSPITAL_BASED_OUTPATIENT_CLINIC_OR_DEPARTMENT_OTHER): Payer: Self-pay

## 2023-04-04 ENCOUNTER — Other Ambulatory Visit (HOSPITAL_BASED_OUTPATIENT_CLINIC_OR_DEPARTMENT_OTHER): Payer: Self-pay

## 2023-04-04 DIAGNOSIS — I878 Other specified disorders of veins: Secondary | ICD-10-CM | POA: Diagnosis not present

## 2023-04-04 DIAGNOSIS — M362 Hemophilic arthropathy: Secondary | ICD-10-CM | POA: Diagnosis not present

## 2023-04-04 DIAGNOSIS — D66 Hereditary factor VIII deficiency: Secondary | ICD-10-CM | POA: Diagnosis not present

## 2023-04-04 DIAGNOSIS — Z79899 Other long term (current) drug therapy: Secondary | ICD-10-CM | POA: Diagnosis not present

## 2023-04-04 DIAGNOSIS — G8929 Other chronic pain: Secondary | ICD-10-CM | POA: Diagnosis not present

## 2023-04-04 DIAGNOSIS — R634 Abnormal weight loss: Secondary | ICD-10-CM | POA: Diagnosis not present

## 2023-04-04 DIAGNOSIS — F1190 Opioid use, unspecified, uncomplicated: Secondary | ICD-10-CM | POA: Diagnosis not present

## 2023-04-04 DIAGNOSIS — R918 Other nonspecific abnormal finding of lung field: Secondary | ICD-10-CM | POA: Diagnosis not present

## 2023-04-04 DIAGNOSIS — Z452 Encounter for adjustment and management of vascular access device: Secondary | ICD-10-CM | POA: Diagnosis not present

## 2023-04-04 MED ORDER — MORPHINE SULFATE 15 MG PO TABS
ORAL_TABLET | ORAL | 0 refills | Status: DC
  Filled 2023-04-04: qty 60, 30d supply, fill #0

## 2023-04-05 ENCOUNTER — Other Ambulatory Visit (HOSPITAL_BASED_OUTPATIENT_CLINIC_OR_DEPARTMENT_OTHER): Payer: Self-pay

## 2023-04-05 MED ORDER — VITAMIN B-12 1000 MCG PO TABS
1000.0000 ug | ORAL_TABLET | Freq: Every day | ORAL | 3 refills | Status: AC
  Filled 2023-04-05: qty 100, 100d supply, fill #0
  Filled 2023-08-26: qty 100, 100d supply, fill #1

## 2023-04-08 ENCOUNTER — Other Ambulatory Visit (HOSPITAL_BASED_OUTPATIENT_CLINIC_OR_DEPARTMENT_OTHER): Payer: Self-pay

## 2023-05-16 ENCOUNTER — Other Ambulatory Visit (HOSPITAL_BASED_OUTPATIENT_CLINIC_OR_DEPARTMENT_OTHER): Payer: Self-pay

## 2023-05-16 MED ORDER — MORPHINE SULFATE 15 MG PO TABS
15.0000 mg | ORAL_TABLET | Freq: Two times a day (BID) | ORAL | 0 refills | Status: DC
Start: 2023-05-16 — End: 2023-06-11
  Filled 2023-05-16: qty 60, 30d supply, fill #0

## 2023-06-11 ENCOUNTER — Other Ambulatory Visit (HOSPITAL_BASED_OUTPATIENT_CLINIC_OR_DEPARTMENT_OTHER): Payer: Self-pay

## 2023-06-11 MED ORDER — MORPHINE SULFATE 15 MG PO TABS
ORAL_TABLET | ORAL | 0 refills | Status: DC
Start: 2023-06-11 — End: 2023-06-25
  Filled 2023-06-17: qty 30, 30d supply, fill #0

## 2023-06-17 ENCOUNTER — Other Ambulatory Visit (HOSPITAL_BASED_OUTPATIENT_CLINIC_OR_DEPARTMENT_OTHER): Payer: Self-pay

## 2023-06-18 ENCOUNTER — Other Ambulatory Visit: Payer: Self-pay

## 2023-06-25 ENCOUNTER — Other Ambulatory Visit (HOSPITAL_BASED_OUTPATIENT_CLINIC_OR_DEPARTMENT_OTHER): Payer: Self-pay

## 2023-06-25 MED ORDER — MORPHINE SULFATE 15 MG PO TABS
7.5000 mg | ORAL_TABLET | Freq: Three times a day (TID) | ORAL | 0 refills | Status: AC | PRN
Start: 2023-06-25 — End: ?
  Filled 2023-07-08: qty 45, 30d supply, fill #0
  Filled 2023-07-08 (×2): qty 90, 60d supply, fill #0

## 2023-06-28 ENCOUNTER — Other Ambulatory Visit (HOSPITAL_BASED_OUTPATIENT_CLINIC_OR_DEPARTMENT_OTHER): Payer: Self-pay

## 2023-07-08 ENCOUNTER — Other Ambulatory Visit (HOSPITAL_BASED_OUTPATIENT_CLINIC_OR_DEPARTMENT_OTHER): Payer: Self-pay

## 2023-07-19 DIAGNOSIS — Z452 Encounter for adjustment and management of vascular access device: Secondary | ICD-10-CM | POA: Diagnosis not present

## 2023-07-19 DIAGNOSIS — D66 Hereditary factor VIII deficiency: Secondary | ICD-10-CM | POA: Diagnosis not present

## 2023-07-24 DIAGNOSIS — D66 Hereditary factor VIII deficiency: Secondary | ICD-10-CM | POA: Diagnosis not present

## 2023-07-24 DIAGNOSIS — T82594A Other mechanical complication of infusion catheter, initial encounter: Secondary | ICD-10-CM | POA: Diagnosis not present

## 2023-07-24 DIAGNOSIS — Z452 Encounter for adjustment and management of vascular access device: Secondary | ICD-10-CM | POA: Diagnosis not present

## 2023-07-31 ENCOUNTER — Other Ambulatory Visit: Payer: Self-pay | Admitting: Family Medicine

## 2023-07-31 DIAGNOSIS — R911 Solitary pulmonary nodule: Secondary | ICD-10-CM

## 2023-08-01 ENCOUNTER — Other Ambulatory Visit (HOSPITAL_BASED_OUTPATIENT_CLINIC_OR_DEPARTMENT_OTHER): Payer: Self-pay

## 2023-08-01 MED ORDER — MORPHINE SULFATE 15 MG PO TABS
7.5000 mg | ORAL_TABLET | Freq: Three times a day (TID) | ORAL | 0 refills | Status: DC | PRN
Start: 2023-08-01 — End: 2023-08-09

## 2023-08-07 ENCOUNTER — Other Ambulatory Visit (HOSPITAL_BASED_OUTPATIENT_CLINIC_OR_DEPARTMENT_OTHER): Payer: Self-pay

## 2023-08-09 ENCOUNTER — Other Ambulatory Visit (HOSPITAL_BASED_OUTPATIENT_CLINIC_OR_DEPARTMENT_OTHER): Payer: Self-pay

## 2023-08-22 DIAGNOSIS — I878 Other specified disorders of veins: Secondary | ICD-10-CM | POA: Diagnosis not present

## 2023-08-22 DIAGNOSIS — Z452 Encounter for adjustment and management of vascular access device: Secondary | ICD-10-CM | POA: Diagnosis not present

## 2023-08-22 DIAGNOSIS — F17210 Nicotine dependence, cigarettes, uncomplicated: Secondary | ICD-10-CM | POA: Diagnosis not present

## 2023-08-22 DIAGNOSIS — D66 Hereditary factor VIII deficiency: Secondary | ICD-10-CM | POA: Diagnosis not present

## 2023-08-22 DIAGNOSIS — I871 Compression of vein: Secondary | ICD-10-CM | POA: Diagnosis not present

## 2023-08-26 ENCOUNTER — Other Ambulatory Visit (HOSPITAL_BASED_OUTPATIENT_CLINIC_OR_DEPARTMENT_OTHER): Payer: Self-pay

## 2023-08-29 ENCOUNTER — Other Ambulatory Visit (HOSPITAL_BASED_OUTPATIENT_CLINIC_OR_DEPARTMENT_OTHER): Payer: Self-pay

## 2023-08-29 DIAGNOSIS — F1190 Opioid use, unspecified, uncomplicated: Secondary | ICD-10-CM | POA: Diagnosis not present

## 2023-08-29 DIAGNOSIS — Z79899 Other long term (current) drug therapy: Secondary | ICD-10-CM | POA: Diagnosis not present

## 2023-08-29 DIAGNOSIS — Z452 Encounter for adjustment and management of vascular access device: Secondary | ICD-10-CM | POA: Diagnosis not present

## 2023-08-29 DIAGNOSIS — L299 Pruritus, unspecified: Secondary | ICD-10-CM | POA: Diagnosis not present

## 2023-08-29 DIAGNOSIS — M362 Hemophilic arthropathy: Secondary | ICD-10-CM | POA: Diagnosis not present

## 2023-08-29 DIAGNOSIS — E538 Deficiency of other specified B group vitamins: Secondary | ICD-10-CM | POA: Diagnosis not present

## 2023-08-29 DIAGNOSIS — D66 Hereditary factor VIII deficiency: Secondary | ICD-10-CM | POA: Diagnosis not present

## 2023-08-29 DIAGNOSIS — R634 Abnormal weight loss: Secondary | ICD-10-CM | POA: Diagnosis not present

## 2023-08-29 DIAGNOSIS — I878 Other specified disorders of veins: Secondary | ICD-10-CM | POA: Diagnosis not present

## 2023-08-29 MED ORDER — HYDROXYZINE HCL 10 MG PO TABS
ORAL_TABLET | ORAL | 12 refills | Status: AC
  Filled 2023-08-29: qty 30, 10d supply, fill #0

## 2023-08-29 MED ORDER — VITAMIN B-12 1000 MCG PO TABS: 1000.0000 ug | ORAL_TABLET | Freq: Every day | ORAL | 3 refills | Status: AC

## 2023-08-29 MED ORDER — OXAYDO 7.5 MG PO TABS
7.5000 mg | ORAL_TABLET | Freq: Three times a day (TID) | ORAL | 0 refills | Status: DC | PRN
  Filled 2023-08-29: qty 90, 30d supply, fill #0

## 2023-08-30 ENCOUNTER — Other Ambulatory Visit (HOSPITAL_BASED_OUTPATIENT_CLINIC_OR_DEPARTMENT_OTHER): Payer: Self-pay

## 2023-09-04 ENCOUNTER — Other Ambulatory Visit (HOSPITAL_BASED_OUTPATIENT_CLINIC_OR_DEPARTMENT_OTHER): Payer: Self-pay

## 2023-09-04 MED ORDER — OXYCODONE HCL 15 MG PO TABA
7.5000 mg | ORAL_TABLET | Freq: Three times a day (TID) | ORAL | 0 refills | Status: DC | PRN
  Filled 2023-09-04: qty 45, 30d supply, fill #0

## 2023-09-04 MED ORDER — OXYCODONE HCL 15 MG PO TABS
ORAL_TABLET | ORAL | 0 refills | Status: DC
  Filled 2023-09-04: qty 45, 30d supply, fill #0

## 2023-09-26 DIAGNOSIS — K7460 Unspecified cirrhosis of liver: Secondary | ICD-10-CM | POA: Diagnosis not present

## 2023-09-26 DIAGNOSIS — K8020 Calculus of gallbladder without cholecystitis without obstruction: Secondary | ICD-10-CM | POA: Diagnosis not present

## 2023-09-26 DIAGNOSIS — F17200 Nicotine dependence, unspecified, uncomplicated: Secondary | ICD-10-CM | POA: Diagnosis not present

## 2023-09-26 DIAGNOSIS — D66 Hereditary factor VIII deficiency: Secondary | ICD-10-CM | POA: Diagnosis not present

## 2023-09-26 DIAGNOSIS — E538 Deficiency of other specified B group vitamins: Secondary | ICD-10-CM | POA: Diagnosis not present

## 2023-09-26 DIAGNOSIS — Z452 Encounter for adjustment and management of vascular access device: Secondary | ICD-10-CM | POA: Diagnosis not present

## 2023-09-26 DIAGNOSIS — L299 Pruritus, unspecified: Secondary | ICD-10-CM | POA: Diagnosis not present

## 2023-09-26 DIAGNOSIS — R21 Rash and other nonspecific skin eruption: Secondary | ICD-10-CM | POA: Diagnosis not present

## 2023-09-26 DIAGNOSIS — R634 Abnormal weight loss: Secondary | ICD-10-CM | POA: Diagnosis not present

## 2023-09-26 DIAGNOSIS — T81509A Unspecified complication of foreign body accidentally left in body following unspecified procedure, initial encounter: Secondary | ICD-10-CM | POA: Diagnosis not present

## 2023-10-03 ENCOUNTER — Encounter: Payer: Self-pay | Admitting: Gastroenterology

## 2023-10-30 ENCOUNTER — Other Ambulatory Visit (HOSPITAL_BASED_OUTPATIENT_CLINIC_OR_DEPARTMENT_OTHER): Payer: Self-pay

## 2023-10-30 MED ORDER — OXYCODONE HCL 15 MG PO TABS
ORAL_TABLET | ORAL | 0 refills | Status: DC
  Filled 2023-10-30: qty 45, 30d supply, fill #0

## 2023-11-27 ENCOUNTER — Ambulatory Visit: Admitting: Gastroenterology

## 2023-12-04 ENCOUNTER — Other Ambulatory Visit (HOSPITAL_BASED_OUTPATIENT_CLINIC_OR_DEPARTMENT_OTHER): Payer: Self-pay

## 2023-12-18 ENCOUNTER — Other Ambulatory Visit (HOSPITAL_BASED_OUTPATIENT_CLINIC_OR_DEPARTMENT_OTHER): Payer: Self-pay

## 2023-12-18 MED ORDER — OXYCODONE HCL 15 MG PO TABS
7.5000 mg | ORAL_TABLET | Freq: Three times a day (TID) | ORAL | 0 refills | Status: AC
  Filled 2023-12-18: qty 45, 30d supply, fill #0

## 2024-01-28 ENCOUNTER — Other Ambulatory Visit (HOSPITAL_BASED_OUTPATIENT_CLINIC_OR_DEPARTMENT_OTHER): Payer: Self-pay

## 2024-01-28 MED ORDER — OXYCODONE HCL 15 MG PO TABS
7.5000 mg | ORAL_TABLET | Freq: Three times a day (TID) | ORAL | 0 refills | Status: AC | PRN
  Filled 2024-01-28: qty 45, 30d supply, fill #0

## 2024-03-31 ENCOUNTER — Other Ambulatory Visit (HOSPITAL_BASED_OUTPATIENT_CLINIC_OR_DEPARTMENT_OTHER): Payer: Self-pay

## 2024-03-31 MED ORDER — OXYCODONE HCL 15 MG PO TABS
ORAL_TABLET | ORAL | 0 refills | Status: AC
  Filled 2024-03-31: qty 45, 30d supply, fill #0
